# Patient Record
Sex: Male | Born: 1985 | Race: Black or African American | Hispanic: No | Marital: Single | Smoking: Former smoker
Health system: Southern US, Community
[De-identification: ages and names within clinical notes are randomized; demographics above are authoritative.]

## PROBLEM LIST (undated history)

## (undated) DIAGNOSIS — J40 Bronchitis, not specified as acute or chronic: Secondary | ICD-10-CM

## (undated) DIAGNOSIS — F319 Bipolar disorder, unspecified: Secondary | ICD-10-CM

## (undated) DIAGNOSIS — F909 Attention-deficit hyperactivity disorder, unspecified type: Secondary | ICD-10-CM

## (undated) DIAGNOSIS — M549 Dorsalgia, unspecified: Secondary | ICD-10-CM

## (undated) DIAGNOSIS — F489 Nonpsychotic mental disorder, unspecified: Secondary | ICD-10-CM

## (undated) DIAGNOSIS — M519 Unspecified thoracic, thoracolumbar and lumbosacral intervertebral disc disorder: Secondary | ICD-10-CM

---

## 2013-05-18 ENCOUNTER — Ambulatory Visit
Admission: RE | Admit: 2013-05-18 | Discharge: 2013-05-18 | Disposition: A | Discharge status: Home / Self Care | Payer: Medicaid Other | Source: Ambulatory Visit | Attending: Family Medicine | Admitting: Family Medicine

## 2013-05-18 ENCOUNTER — Other Ambulatory Visit: Payer: Self-pay | Admitting: Family Medicine

## 2013-05-18 DIAGNOSIS — M545 Low back pain, unspecified: Secondary | ICD-10-CM

## 2013-05-18 DIAGNOSIS — F17200 Nicotine dependence, unspecified, uncomplicated: Secondary | ICD-10-CM

## 2013-12-28 ENCOUNTER — Emergency Department (HOSPITAL_COMMUNITY)
Admission: EM | Admit: 2013-12-28 | Discharge: 2013-12-28 | Disposition: A | Discharge status: Home / Self Care | Payer: Medicaid Other | Attending: Emergency Medicine | Admitting: Emergency Medicine

## 2013-12-28 ENCOUNTER — Encounter (HOSPITAL_COMMUNITY): Payer: Self-pay | Admitting: Emergency Medicine

## 2013-12-28 DIAGNOSIS — L0231 Cutaneous abscess of buttock: Secondary | ICD-10-CM | POA: Insufficient documentation

## 2013-12-28 DIAGNOSIS — Z8739 Personal history of other diseases of the musculoskeletal system and connective tissue: Secondary | ICD-10-CM | POA: Insufficient documentation

## 2013-12-28 DIAGNOSIS — L03317 Cellulitis of buttock: Principal | ICD-10-CM

## 2013-12-28 DIAGNOSIS — F17200 Nicotine dependence, unspecified, uncomplicated: Secondary | ICD-10-CM | POA: Insufficient documentation

## 2013-12-28 MED ORDER — HYDROCODONE-ACETAMINOPHEN 5-325 MG PO TABS: 1.0000 | ORAL_TABLET | Freq: Four times a day (QID) | ORAL | Status: DC | PRN

## 2013-12-28 MED ORDER — LORAZEPAM 1 MG PO TABS
1.0000 mg | ORAL_TABLET | Freq: Once | ORAL | Status: AC
  Administered 2013-12-28: 1 mg via ORAL
  Filled 2013-12-28: qty 2

## 2013-12-28 MED ORDER — IBUPROFEN 800 MG PO TABS: 800.0000 mg | ORAL_TABLET | Freq: Three times a day (TID) | ORAL | Status: DC | PRN

## 2013-12-28 MED ORDER — HYDROMORPHONE HCL PF 1 MG/ML IJ SOLN
1.0000 mg | Freq: Once | INTRAMUSCULAR | Status: AC
  Administered 2013-12-28: 1 mg via INTRAMUSCULAR
  Filled 2013-12-28: qty 1

## 2013-12-28 NOTE — ED Provider Notes (Signed)
Medical screening examination/treatment/procedure(s) were performed by non-physician practitioner and as supervising physician I was immediately available for consultation/collaboration.  EKG Interpretation   None         Layla MawKristen N Ward, DO 12/28/13 1351

## 2013-12-28 NOTE — ED Provider Notes (Signed)
CSN: [id number]     Arrival date & time 12/28/13  1029 History   First MD Initiated Contact with Patient 12/28/13 1122     Chief Complaint  Patient presents with  . Hemorrhoids     (Consider location/radiation/quality/duration/timing/severity/associated sxs/prior Treatment) HPI HPI Comments: William Reese is a 28 y.o. male who presents to the Emergency Department complaining of an abscess on his right buttocks that has been there for 2 days. Patient doesn't not remember any injury that could have caused this.  He has had an abscess before in his armpits. Denies nausea, vomiting, diarrhea, fever, abdominal pain, chest pain, and SOB.   Past Medical History  Diagnosis Date  . Arthritis    History reviewed. No pertinent past surgical history. History reviewed. No pertinent family history. History  Substance Use Topics  . Smoking status: Current Every Day Smoker    Types: Cigarettes  . Smokeless tobacco: Not on file  . Alcohol Use: Yes    Review of Systems All other systems negative except as documented in the HPI. All pertinent positives and negatives as reviewed in the HPI.    Allergies  Flexeril  Home Medications   Current Outpatient Rx  Name  Route  Sig  Dispense  Refill  . phenylephrine-shark liver oil-mineral oil-petrolatum (PREPARATION H) 0.25-3-14-71.9 % rectal ointment   Rectal   Place 1 application rectally 2 (two) times daily as needed for hemorrhoids.          BP 140/84  Pulse 99  Temp(Src) 97.6 F (36.4 C) (Oral)  Resp 16  Ht 6' (1.829 m)  Wt 207 lb 12.8 oz (94.257 kg)  BMI 28.18 kg/m2  SpO2 97% Physical Exam  Constitutional: He is oriented to person, place, and time. He appears well-developed and well-nourished. No distress.  HENT:  Head: Normocephalic and atraumatic.  Neck: Normal range of motion. Neck supple.  Cardiovascular: Normal rate, regular rhythm and normal heart sounds.   Pulmonary/Chest: Effort normal and breath sounds normal.    Abdominal: Soft. Bowel sounds are normal. There is no tenderness.  Neurological: He is alert and oriented to person, place, and time.  Skin: Skin is warm and dry. No rash noted. He is not diaphoretic. No pallor.       ED Course  Procedures (including critical care time)   INCISION AND DRAINAGE Performed by: Carlyle DollyLAWYER,Harout Scheurich W Consent: Verbal consent obtained. Risks and benefits: risks, benefits and alternatives were discussed Type: abscess  Body area: Right medial mid buttock  Anesthesia: local infiltration  Incision was made with a scalpel.  Local anesthetic: lidocaine 2 % with epinephrine  Anesthetic total: 7 ml  Complexity: complex Blunt dissection to break up loculations  Drainage: purulent  Drainage amount: Large   Packing material: 1/4 in iodoform gauze  Patient tolerance: Patient tolerated the procedure well with no immediate complications.  Patient is referred to general surgery.  Told to return here as needed.  Told to use warm compresses around the area.  Keep the area covered   Carlyle DollyChristopher W Oria Klimas, PA-C 12/28/13 1310

## 2013-12-28 NOTE — ED Notes (Signed)
"  I'm ready to get it done..the numbing is wearing off".

## 2013-12-28 NOTE — ED Notes (Signed)
Pt states he has a very painful hemorrhoid since Monday hes applied preparation h and witch hazel and took hot baths with no relief. No bleeding

## 2013-12-28 NOTE — Discharge Instructions (Signed)
Return here as needed.  Followup with the surgeon, provided.  Use heat around the area keep the area covered.have the packing out in 2 days

## 2013-12-30 ENCOUNTER — Encounter (HOSPITAL_COMMUNITY): Payer: Self-pay | Admitting: Emergency Medicine

## 2013-12-30 ENCOUNTER — Emergency Department (HOSPITAL_COMMUNITY)
Admission: EM | Admit: 2013-12-30 | Discharge: 2013-12-30 | Disposition: A | Discharge status: Home / Self Care | Payer: Medicaid Other | Attending: Emergency Medicine | Admitting: Emergency Medicine

## 2013-12-30 DIAGNOSIS — F17200 Nicotine dependence, unspecified, uncomplicated: Secondary | ICD-10-CM | POA: Insufficient documentation

## 2013-12-30 DIAGNOSIS — Z09 Encounter for follow-up examination after completed treatment for conditions other than malignant neoplasm: Secondary | ICD-10-CM

## 2013-12-30 DIAGNOSIS — Z4801 Encounter for change or removal of surgical wound dressing: Secondary | ICD-10-CM | POA: Insufficient documentation

## 2013-12-30 DIAGNOSIS — M129 Arthropathy, unspecified: Secondary | ICD-10-CM | POA: Insufficient documentation

## 2013-12-30 NOTE — Discharge Instructions (Signed)
Abscess  Care After  An abscess (also called a boil or furuncle) is an infected area that contains a collection of pus. Signs and symptoms of an abscess include pain, tenderness, redness, or hardness, or you may feel a moveable soft area under your skin. An abscess can occur anywhere in the body. The infection may spread to surrounding tissues causing cellulitis. A cut (incision) by the surgeon was made over your abscess and the pus was drained out. Gauze may have been packed into the space to provide a drain that will allow the cavity to heal from the inside outwards. The boil may be painful for 5 to 7 days. Most people with a boil do not have high fevers. Your abscess, if seen early, may not have localized, and may not have been lanced. If not, another appointment may be required for this if it does not get better on its own or with medications.  HOME CARE INSTRUCTIONS   · Only take over-the-counter or prescription medicines for pain, discomfort, or fever as directed by your caregiver.  · When you bathe, soak and then remove gauze or iodoform packs at least daily or as directed by your caregiver. You may then wash the wound gently with mild soapy water. Repack with gauze or do as your caregiver directs.  SEEK IMMEDIATE MEDICAL CARE IF:   · You develop increased pain, swelling, redness, drainage, or bleeding in the wound site.  · You develop signs of generalized infection including muscle aches, chills, fever, or a general ill feeling.  · An oral temperature above 102° F (38.9° C) develops, not controlled by medication.  See your caregiver for a recheck if you develop any of the symptoms described above. If medications (antibiotics) were prescribed, take them as directed.  Document Released: 05/08/2005 Document Revised: 01/12/2012 Document Reviewed: 01/03/2008  ExitCare® Patient Information ©2014 [employer].

## 2013-12-30 NOTE — ED Provider Notes (Signed)
CSN: [id number]     Arrival date & time 12/30/13  1449 History  This chart was scribed for non-physician practitioner, Elpidio AnisShari Keiara Sneeringer, PA-C working with Gavin PoundMichael Y. Oletta LamasGhim, MD by Luisa DagoPriscilla Tutu, ED scribe. This patient was seen in room TR07C/TR07C and the patient's care was started at 6:11 PM.    Chief Complaint  Patient presents with  . Follow-up    The history is provided by the patient. No language interpreter was used.   HPI Comments: William Reese is a 28 y.o. male who presents to the Emergency Department requesting a follow up. Pt states that she had an abscess drained 2 days ago and is here to get the packing removed. Pt states that the pain has subsided a little from the last time he was in the ED. He denies any pertinent medical history. Denies any fever, chills, diarrhea, emesis, nausea, or diaphoresis.   Past Medical History  Diagnosis Date  . Arthritis    History reviewed. No pertinent past surgical history. History reviewed. No pertinent family history. History  Substance Use Topics  . Smoking status: Current Every Day Smoker    Types: Cigarettes  . Smokeless tobacco: Not on file  . Alcohol Use: Yes    Review of Systems  Constitutional: Negative for fever, chills and diaphoresis.  Gastrointestinal: Negative for nausea, vomiting and diarrhea.  Skin: Positive for wound (drained abscess to right buttock).   Allergies  Morphine and related and Flexeril  Home Medications   Current Outpatient Rx  Name  Route  Sig  Dispense  Refill  . HYDROcodone-acetaminophen (NORCO/VICODIN) 5-325 MG per tablet   Oral   Take 1 tablet by mouth every 6 (six) hours as needed for moderate pain.   15 tablet   0   . ibuprofen (ADVIL,MOTRIN) 800 MG tablet   Oral   Take 1 tablet (800 mg total) by mouth every 8 (eight) hours as needed.   21 tablet   0     BP 116/71  Pulse 118  Temp(Src) 98.4 F (36.9 C)  Resp 16  SpO2 96%  Physical Exam  Nursing note and vitals  reviewed. Constitutional: He is oriented to person, place, and time. He appears well-developed and well-nourished.  HENT:  Head: Normocephalic and atraumatic.  Cardiovascular: Normal rate.   Pulmonary/Chest: Effort normal.  Abdominal: He exhibits no distension.  Genitourinary:  Abscess with packing intact and mild to moderate continues purulent drainage that is malodorous. Induration to approximately 1 cm surrounding 2 cm incision.  Neurological: He is alert and oriented to person, place, and time.  Skin: Skin is warm and dry.  Psychiatric: He has a normal mood and affect.    ED Course  Procedures (including critical care time)  DIAGNOSTIC STUDIES: Oxygen Saturation is 96% on RA, low by my interpretation.    COORDINATION OF CARE: 6:18 PM- Packing was removed without difficulty. Pt advised of plan for treatment and pt agrees.  Labs Review Labs Reviewed - No data to display Imaging Review No results found.  EKG Interpretation  None  MDM   Final diagnoses:  None    1. Abscess recheck  Packing removed without difficulty. Care instructions given.  I personally performed the services described in this documentation, which was scribed in my presence. The recorded information has been reviewed and is accurate.     Arnoldo HookerShari A Michaeal Davis, PA-C 12/30/13 1825

## 2013-12-30 NOTE — ED Notes (Signed)
Per pt had abscess drained 2 days ago and is here for follow up and to have packing removed.

## 2013-12-30 NOTE — ED Notes (Signed)
Pt st's abscess has draining a lot.  St's wik still in place.

## 2013-12-31 NOTE — ED Provider Notes (Signed)
Medical screening examination/treatment/procedure(s) were performed by non-physician practitioner and as supervising physician I was immediately available for consultation/collaboration.   EKG Interpretation None        Lahoma Constantin Y. Decie Verne, MD 12/31/13 0046 

## 2014-02-16 ENCOUNTER — Emergency Department (HOSPITAL_COMMUNITY)
Admission: EM | Admit: 2014-02-16 | Discharge: 2014-02-16 | Disposition: A | Discharge status: Home / Self Care | Payer: Medicaid Other | Source: Home / Self Care

## 2014-02-16 ENCOUNTER — Encounter (HOSPITAL_COMMUNITY): Payer: Self-pay | Admitting: Emergency Medicine

## 2014-02-16 DIAGNOSIS — M549 Dorsalgia, unspecified: Secondary | ICD-10-CM

## 2014-02-16 DIAGNOSIS — G8929 Other chronic pain: Secondary | ICD-10-CM

## 2014-02-16 NOTE — ED Notes (Signed)
Pt      Observed  Walking  Out  Of  Exam room  Without  Discharge  Papers  Or     Plan  Of  Care

## 2014-02-16 NOTE — ED Provider Notes (Signed)
CSN: [id number]     Arrival date & time 02/16/14  1256 History   First MD Initiated Contact with Patient 02/16/14 1351     Chief Complaint  Patient presents with  . Medication Refill   (Consider location/radiation/quality/duration/timing/severity/associated sxs/prior Treatment) HPI Comments: Pt here for refill of his Norco for his chronic back pain. He was late for his appointment this AM with his PCP that regularly prescribes his med. He st his PCP told him to go to an Urgent Care.  Before I saw him one of our employees witnessed him run to his car for a phone call. PT is insisting his pain is currently severe 20/10 now.  St has hx of a disc problem   Past Medical History  Diagnosis Date  . Arthritis    History reviewed. No pertinent past surgical history. No family history on file. History  Substance Use Topics  . Smoking status: Current Every Day Smoker    Types: Cigarettes  . Smokeless tobacco: Not on file  . Alcohol Use: Yes    Review of Systems  Constitutional: Negative.   Musculoskeletal: Positive for back pain. Negative for joint swelling and neck pain.  Neurological: Negative.     Allergies  Morphine and related and Flexeril  Home Medications   Prior to Admission medications   Medication Sig Start Date End Date Taking? Authorizing Provider  HYDROcodone-acetaminophen (NORCO/VICODIN) 5-325 MG per tablet Take 1 tablet by mouth every 6 (six) hours as needed for moderate pain. 12/28/13   Jamesetta Orleanshristopher W Lawyer, PA-C  ibuprofen (ADVIL,MOTRIN) 800 MG tablet Take 1 tablet (800 mg total) by mouth every 8 (eight) hours as needed. 12/28/13   Jamesetta Orleanshristopher W Lawyer, PA-C   BP 134/77  Pulse 66  Temp(Src) 98 F (36.7 C) (Oral)  Resp 14  SpO2 98% Physical Exam  Nursing note and vitals reviewed. Constitutional: He is oriented to person, place, and time. He appears well-developed and well-nourished.  Relaxed posturing, was smiling on initial arrival, able to get onto and off  the table energetically and without difficulty . Demonstrates no limitations in movement, ambulation, sitting and standing. No facial expressions of pain. Does not appear to be in any distress or pain which an observer would believe pain level 20/10.   HENT:  Head: Normocephalic and atraumatic.  Eyes: EOM are normal. Left eye exhibits no discharge.  Neck: Normal range of motion. Neck supple.  Musculoskeletal:  Tenderness to the bilat paralumbar musculature. No swelling or deformities.  Neurological: He is alert and oriented to person, place, and time. No cranial nerve deficit.  Skin: Skin is warm and dry.  Psychiatric: He has a normal mood and affect.    ED Course  Procedures (including critical care time) Labs Review Labs Reviewed - No data to display  No results found for this or any previous visit. Imaging Review No results found.   MDM   1. Chronic back pain greater than 3 months duration     When pt was advised that we would not refill his monthly requirements of Norco he became dissatisfied and left. He was advised that I could give him 5 tablets today if he is in severe pain but no more. He declined and walked out.    Hayden Rasmussenavid Roniesha Hollingshead, NP 02/16/14 1416

## 2014-02-16 NOTE — Discharge Instructions (Signed)
Chronic Back Pain   When back pain lasts longer than 3 months, it is called chronic back pain.People with chronic back pain often go through certain periods that are more intense (flare-ups).   CAUSES  Chronic back pain can be caused by wear and tear (degeneration) on different structures in your back. These structures include:   The bones of your spine (vertebrae) and the joints surrounding your spinal cord and nerve roots (facets).   The strong, fibrous tissues that connect your vertebrae (ligaments).  Degeneration of these structures may result in pressure on your nerves. This can lead to constant pain.  HOME CARE INSTRUCTIONS   Avoid bending, heavy lifting, prolonged sitting, and activities which make the problem worse.   Take brief periods of rest throughout the day to reduce your pain. Lying down or standing usually is better than sitting while you are resting.   Take over-the-counter or prescription medicines only as directed by your caregiver.  SEEK IMMEDIATE MEDICAL CARE IF:    You have weakness or numbness in one of your legs or feet.   You have trouble controlling your bladder or bowels.   You have nausea, vomiting, abdominal pain, shortness of breath, or fainting.  Document Released: 11/27/2004 Document Revised: 01/12/2012 Document Reviewed: 10/04/2011  ExitCare Patient Information 2014 [employer].

## 2014-02-16 NOTE — ED Notes (Signed)
Pt  Reports  Low  Back  Pain   He      Presents  To  ucc  Requesting  A  Refill  Of  His  Hydrocodone         He  States  He    Missed  His  appt  With  Dr  Chriss DriverHeidi  Little            -   He  Reports  A  History   Of  Back  Problems              He  denys  An  Recent  Injury    - he  Is  Up  Ambulating in room

## 2014-02-17 NOTE — ED Provider Notes (Signed)
Medical screening examination/treatment/procedure(s) were performed by a resident physician or non-physician practitioner and as the supervising physician I was immediately available for consultation/collaboration.  Chanti Golubski, MD    Katreena Schupp S Amandajo Gonder, MD 02/17/14 0805 

## 2015-01-27 ENCOUNTER — Emergency Department (HOSPITAL_COMMUNITY)
Admission: EM | Admit: 2015-01-27 | Discharge: 2015-01-27 | Disposition: A | Discharge status: Home / Self Care | Payer: Medicaid Other | Source: Home / Self Care | Attending: Emergency Medicine | Admitting: Emergency Medicine

## 2015-01-27 ENCOUNTER — Encounter (HOSPITAL_COMMUNITY): Payer: Self-pay

## 2015-01-27 ENCOUNTER — Other Ambulatory Visit (HOSPITAL_COMMUNITY)
Admission: RE | Admit: 2015-01-27 | Discharge: 2015-01-27 | Disposition: A | Discharge status: Home / Self Care | Payer: Medicaid Other | Source: Ambulatory Visit | Attending: Emergency Medicine | Admitting: Emergency Medicine

## 2015-01-27 DIAGNOSIS — Z113 Encounter for screening for infections with a predominantly sexual mode of transmission: Secondary | ICD-10-CM | POA: Diagnosis not present

## 2015-01-27 DIAGNOSIS — Z202 Contact with and (suspected) exposure to infections with a predominantly sexual mode of transmission: Secondary | ICD-10-CM | POA: Diagnosis not present

## 2015-01-27 HISTORY — DX: Nonpsychotic mental disorder, unspecified: F48.9

## 2015-01-27 LAB — POCT URINALYSIS DIP (DEVICE)
Bilirubin Urine: NEGATIVE
Glucose, UA: NEGATIVE mg/dL
Hgb urine dipstick: NEGATIVE
Ketones, ur: NEGATIVE mg/dL
Nitrite: NEGATIVE
PROTEIN: 100 mg/dL — AB
Specific Gravity, Urine: >=1.03 (ref 1.005–1.030)
Urobilinogen, UA: 1 mg/dL (ref 0.0–1.0)
pH: 8.5 — ABNORMAL HIGH (ref 5.0–8.0)

## 2015-01-27 MED ORDER — AZITHROMYCIN 250 MG PO TABS
1000.0000 mg | ORAL_TABLET | Freq: Once | ORAL | Status: AC
  Administered 2015-01-27: 1000 mg via ORAL

## 2015-01-27 MED ORDER — AZITHROMYCIN 250 MG PO TABS
ORAL_TABLET | ORAL | Status: AC
  Filled 2015-01-27: qty 4

## 2015-01-27 MED ORDER — CEFTRIAXONE SODIUM 250 MG IJ SOLR
250.0000 mg | Freq: Once | INTRAMUSCULAR | Status: AC
  Administered 2015-01-27: 250 mg via INTRAMUSCULAR

## 2015-01-27 MED ORDER — CEFTRIAXONE SODIUM 250 MG IJ SOLR
INTRAMUSCULAR | Status: AC
  Filled 2015-01-27: qty 250

## 2015-01-27 MED ORDER — LIDOCAINE HCL (PF) 1 % IJ SOLN
INTRAMUSCULAR | Status: AC
  Filled 2015-01-27: qty 5

## 2015-01-27 NOTE — ED Notes (Signed)
States he was advised by his girlfriend to be checked for STD. C/o pain ,burning w UA, and urethral d/c

## 2015-01-27 NOTE — ED Provider Notes (Signed)
CSN: [id number]     Arrival date & time 01/27/15  1534 History   First MD Initiated Contact with Patient 01/27/15 1603     Chief Complaint  Patient presents with  . SEXUALLY TRANSMITTED DISEASE   (Consider location/radiation/quality/duration/timing/severity/associated sxs/prior Treatment) HPI  He is a 29 year old man here for evaluation of STD exposure. He states his ex-girlfriend told him he needed to be checked for STDs. He states that in the last day he has had penile discharge, dysuria, and testicular pain. No fevers or chills. No nausea or vomiting.  Past Medical History  Diagnosis Date  . Arthritis   . Mental health problem    History reviewed. No pertinent past surgical history. History reviewed. No pertinent family history. History  Substance Use Topics  . Smoking status: Current Every Day Smoker    Types: Cigarettes  . Smokeless tobacco: Not on file  . Alcohol Use: Yes    Review of Systems  Constitutional: Negative for fever and chills.  Gastrointestinal: Negative for nausea and vomiting.  Genitourinary: Positive for dysuria, discharge and testicular pain.    Allergies  Morphine and related and Flexeril  Home Medications   Prior to Admission medications   Medication Sig Start Date End Date Taking? Authorizing Provider  ARIPiprazole (ABILIFY) 10 MG tablet Take 10 mg by mouth daily.   Yes Historical Provider, MD  HYDROcodone-acetaminophen (NORCO/VICODIN) 5-325 MG per tablet Take 1 tablet by mouth every 6 (six) hours as needed for moderate pain. 12/28/13  Yes Christopher Lawyer, PA-C  lisdexamfetamine (VYVANSE) 20 MG capsule Take 20 mg by mouth daily.   Yes Historical Provider, MD  ibuprofen (ADVIL,MOTRIN) 800 MG tablet Take 1 tablet (800 mg total) by mouth every 8 (eight) hours as needed. 12/28/13   Christopher Lawyer, PA-C   BP 127/75 mmHg  Pulse 79  Temp(Src) 98.2 F (36.8 C) (Oral)  Resp 14  SpO2 100% Physical Exam  Constitutional: He is oriented to person,  place, and time. He appears well-developed and well-nourished. No distress.  Cardiovascular: Normal rate.   Pulmonary/Chest: Effort normal.  Neurological: He is alert and oriented to person, place, and time.    ED Course  Procedures (including critical care time) Labs Review Labs Reviewed  POCT URINALYSIS DIP (DEVICE) - Abnormal; Notable for the following:    pH 8.5 (*)    Protein, ur 100 (*)    Leukocytes, UA TRACE (*)    All other components within normal limits  HIV ANTIBODY (ROUTINE TESTING)  RPR  URINE CYTOLOGY ANCILLARY ONLY    Imaging Review No results found.   MDM   1. STD exposure    Will treat presumptively with Rocephin 250 mg IM and azithromycin 1 g by mouth.  HIV, RPR, urine cytology sent. Follow-up as needed.    Charm RingsErin J Haziel Molner, MD 01/27/15 [phone]

## 2015-01-27 NOTE — ED Notes (Signed)
Call back number for lab issues verified 

## 2015-01-27 NOTE — Discharge Instructions (Signed)
You were treated for gonorrhea and Chlamydia today. We will call you if any of your testing comes back positive. Follow-up as needed.

## 2015-01-28 LAB — RPR: RPR: NONREACTIVE

## 2015-01-28 LAB — HIV ANTIBODY (ROUTINE TESTING W REFLEX): HIV SCREEN 4TH GENERATION: NONREACTIVE

## 2015-01-29 ENCOUNTER — Telehealth (HOSPITAL_COMMUNITY): Payer: Self-pay | Admitting: *Deleted

## 2015-01-29 LAB — URINE CYTOLOGY ANCILLARY ONLY
Chlamydia: NEGATIVE
NEISSERIA GONORRHEA: POSITIVE — AB
Trichomonas: NEGATIVE

## 2015-01-29 NOTE — ED Notes (Addendum)
GC pos., Chlamydia and Trich neg., HIV/RPR non-reactive.  I called pt. Pt. verified x 2 and given results.  Pt. Told he was adequately treated with Rocephin and Zithromax.  Pt. instructed to notify his partner, no sex for 1 week and to practice safe sex. Pt. told he [facility] 6 mos[facility], by appointment. DHHS [facility]. [name], [name] 01/29/2015

## 2015-11-01 ENCOUNTER — Emergency Department (HOSPITAL_COMMUNITY): Payer: Medicaid Other

## 2015-11-01 ENCOUNTER — Emergency Department (HOSPITAL_COMMUNITY)
Admission: EM | Admit: 2015-11-01 | Discharge: 2015-11-01 | Disposition: A | Discharge status: Home / Self Care | Payer: Medicaid Other | Attending: Emergency Medicine | Admitting: Emergency Medicine

## 2015-11-01 ENCOUNTER — Encounter (HOSPITAL_COMMUNITY): Payer: Self-pay | Admitting: Emergency Medicine

## 2015-11-01 DIAGNOSIS — F410 Panic disorder [episodic paroxysmal anxiety] without agoraphobia: Secondary | ICD-10-CM | POA: Diagnosis not present

## 2015-11-01 DIAGNOSIS — M1990 Unspecified osteoarthritis, unspecified site: Secondary | ICD-10-CM | POA: Insufficient documentation

## 2015-11-01 DIAGNOSIS — Z79899 Other long term (current) drug therapy: Secondary | ICD-10-CM | POA: Insufficient documentation

## 2015-11-01 DIAGNOSIS — R079 Chest pain, unspecified: Secondary | ICD-10-CM | POA: Diagnosis present

## 2015-11-01 DIAGNOSIS — Z87891 Personal history of nicotine dependence: Secondary | ICD-10-CM | POA: Diagnosis not present

## 2015-11-01 LAB — CBC
HCT: 45.8 % (ref 39.0–52.0)
HEMOGLOBIN: 15.9 g/dL (ref 13.0–17.0)
MCH: 30.7 pg (ref 26.0–34.0)
MCHC: 34.7 g/dL (ref 30.0–36.0)
MCV: 88.4 fL (ref 78.0–100.0)
Platelets: 234 10*3/uL (ref 150–400)
RBC: 5.18 MIL/uL (ref 4.22–5.81)
RDW: 12.5 % (ref 11.5–15.5)
WBC: 7.2 10*3/uL (ref 4.0–10.5)

## 2015-11-01 LAB — BASIC METABOLIC PANEL
ANION GAP: 9 (ref 5–15)
BUN: 7 mg/dL (ref 6–20)
CO2: 25 mmol/L (ref 22–32)
Calcium: 9.3 mg/dL (ref 8.9–10.3)
Chloride: 105 mmol/L (ref 101–111)
Creatinine, Ser: 0.75 mg/dL (ref 0.61–1.24)
GFR calc Af Amer: >60 mL/min (ref >60)
GFR calc non Af Amer: >60 mL/min (ref >60)
GLUCOSE: 101 mg/dL — AB (ref 65–99)
Potassium: 3.7 mmol/L (ref 3.5–5.1)
Sodium: 139 mmol/L (ref 135–145)

## 2015-11-01 LAB — I-STAT TROPONIN, ED: Troponin i, poc: 0 ng/mL (ref 0.00–0.08)

## 2015-11-01 MED ORDER — DIAZEPAM 5 MG PO TABS: 5.0000 mg | ORAL_TABLET | Freq: Three times a day (TID) | ORAL | Status: DC | PRN

## 2015-11-01 NOTE — ED Provider Notes (Signed)
CSN: [id number]     Arrival date & time 11/01/15  1616 History   First MD Initiated Contact with Patient 11/01/15 1943     Chief Complaint  Patient presents with  . Chest Pain  . Shortness of Breath     (Consider location/radiation/quality/duration/timing/severity/associated sxs/prior Treatment) HPI Comments: Patient currently undergoing counseling for evaluation of borderline personality disorder.  Over the last several weeks, patient has been having recurrent episodes of anxiousness with rapid heart rate, breathing, and sensation of choking.  No history of congenital heart disease or early heart disease in this family.  Former smoker.  Patient is a 29 y.o. male presenting with chest pain, shortness of breath, and anxiety.  Chest Pain Associated symptoms: anxiety and shortness of breath   Shortness of Breath Associated symptoms: chest pain   Anxiety This is a recurrent problem. The current episode started 1 to 4 weeks ago. The problem occurs every several days. The problem has been waxing and waning. Associated symptoms include chest pain. Associated symptoms comments: Fast breathing, sensation of throat closing. The symptoms are aggravated by stress.    Past Medical History  Diagnosis Date  . Arthritis   . Mental health problem    History reviewed. No pertinent past surgical history. History reviewed. No pertinent family history. Social History  Substance Use Topics  . Smoking status: Former Smoker    Types: Cigarettes  . Smokeless tobacco: None  . Alcohol Use: Yes    Review of Systems  Respiratory: Positive for shortness of breath.   Cardiovascular: Positive for chest pain.  Psychiatric/Behavioral: The patient is nervous/anxious.   All other systems reviewed and are negative.     Allergies  Morphine and related and Flexeril  Home Medications   Prior to Admission medications   Medication Sig Start Date End Date Taking? Authorizing Provider  ARIPiprazole  (ABILIFY) 10 MG tablet Take 10 mg by mouth daily.    Historical Provider, MD  HYDROcodone-acetaminophen (NORCO/VICODIN) 5-325 MG per tablet Take 1 tablet by mouth every 6 (six) hours as needed for moderate pain. 12/28/13   Charlestine Night, PA-C  ibuprofen (ADVIL,MOTRIN) 800 MG tablet Take 1 tablet (800 mg total) by mouth every 8 (eight) hours as needed. 12/28/13   Charlestine Night, PA-C  lisdexamfetamine (VYVANSE) 20 MG capsule Take 20 mg by mouth daily.    Historical Provider, MD   BP 144/83 mmHg  Pulse 80  Temp(Src) 98.1 F (36.7 C) (Oral)  Resp 18  SpO2 99% Physical Exam  Constitutional: He is oriented to person, place, and time. He appears well-developed and well-nourished.  HENT:  Head: Normocephalic and atraumatic.  Eyes: Pupils are equal, round, and reactive to light.  Neck: Neck supple.  Cardiovascular: Normal rate, regular rhythm, normal heart sounds and intact distal pulses.   No murmur heard. Pulmonary/Chest: Effort normal and breath sounds normal. No respiratory distress.  Abdominal: Soft. Bowel sounds are normal.  Musculoskeletal: He exhibits no edema or tenderness.  Lymphadenopathy:    He has no cervical adenopathy.  Neurological: He is alert and oriented to person, place, and time.  Skin: Skin is warm and dry.  Psychiatric: His mood appears anxious.  Nursing note and vitals reviewed.   ED Course  Procedures (including critical care time) Labs Review Labs Reviewed  BASIC METABOLIC PANEL - Abnormal; Notable for the following:    Glucose, Bld 101 (*)    All other components within normal limits  CBC  I-STAT TROPOININ, ED    Imaging Review Dg  Chest 2 View  11/01/2015  CLINICAL DATA:  Left-sided chest pain and shortness of Breath EXAM: CHEST - 2 VIEW COMPARISON:  05/18/2013 FINDINGS: The heart size and mediastinal contours are within normal limits. Both lungs are clear. The visualized skeletal structures are unremarkable. IMPRESSION: No active disease.  Electronically Signed   By: Alcide CleverMark  Lukens M.D.   On: 11/01/2015 17:00   I have personally reviewed and evaluated these images and lab results as part of my medical decision-making.   EKG Interpretation   Date/Time:  Thursday November 01 2015 16:29:10 EST Ventricular Rate:  78 PR Interval:  178 QRS Duration: 76 QT Interval:  359 QTC Calculation: 409 R Axis:   70 Text Interpretation:  Sinus rhythm normal axis and intervals no ischemic  changes  No old tracing to compare Confirmed by Rhunette CroftNANAVATI, MD, Janey GentaANKIT  [phone]) on 11/01/2015 8:30:02 PM     Patient's symptoms consistent with panic anxiety. Normal ECG and troponin. No history of early heart disease in family. MDM   Final diagnoses:  None    Anxiety. Care instructions provided. Return precautions discussed.   Felicie Mornavid Shareece Bultman, NP 11/02/15 [id number]  Derwood KaplanAnkit Nanavati, MD 11/02/15 [id number]

## 2015-11-01 NOTE — ED Notes (Signed)
Pt c/o chest tightness and sensation of "heart pounding really fast even though I am calm" onset 10-12-15, with twitching in left arm, intermittent sensation of throat closing.

## 2015-11-01 NOTE — ED Notes (Signed)
Pt reports his cough has been persistent since 12/9, pt believes his cough is attributed to the fact Mold is in his house. Pt reports the county department is coming to his house to evaluate the Mold concerns.

## 2015-11-01 NOTE — Discharge Instructions (Signed)

## 2015-11-01 NOTE — ED Notes (Signed)
No answer from waiting room.

## 2015-11-21 ENCOUNTER — Encounter (HOSPITAL_COMMUNITY): Payer: Self-pay | Admitting: Family Medicine

## 2015-11-21 ENCOUNTER — Emergency Department (HOSPITAL_COMMUNITY): Payer: Medicaid Other

## 2015-11-21 ENCOUNTER — Emergency Department (HOSPITAL_COMMUNITY)
Admission: EM | Admit: 2015-11-21 | Discharge: 2015-11-21 | Disposition: A | Discharge status: Home / Self Care | Payer: Medicaid Other | Attending: Emergency Medicine | Admitting: Emergency Medicine

## 2015-11-21 ENCOUNTER — Emergency Department (HOSPITAL_COMMUNITY)
Admission: EM | Admit: 2015-11-21 | Discharge: 2015-11-21 | Discharge status: Against Medical Advice | Payer: Medicaid Other | Source: Home / Self Care | Attending: Emergency Medicine | Admitting: Emergency Medicine

## 2015-11-21 DIAGNOSIS — Z87891 Personal history of nicotine dependence: Secondary | ICD-10-CM | POA: Insufficient documentation

## 2015-11-21 DIAGNOSIS — F410 Panic disorder [episodic paroxysmal anxiety] without agoraphobia: Secondary | ICD-10-CM | POA: Insufficient documentation

## 2015-11-21 DIAGNOSIS — Z79899 Other long term (current) drug therapy: Secondary | ICD-10-CM

## 2015-11-21 DIAGNOSIS — R0602 Shortness of breath: Secondary | ICD-10-CM | POA: Diagnosis not present

## 2015-11-21 DIAGNOSIS — R079 Chest pain, unspecified: Secondary | ICD-10-CM | POA: Diagnosis present

## 2015-11-21 DIAGNOSIS — Z8709 Personal history of other diseases of the respiratory system: Secondary | ICD-10-CM | POA: Insufficient documentation

## 2015-11-21 DIAGNOSIS — R0789 Other chest pain: Secondary | ICD-10-CM | POA: Insufficient documentation

## 2015-11-21 DIAGNOSIS — Z8739 Personal history of other diseases of the musculoskeletal system and connective tissue: Secondary | ICD-10-CM

## 2015-11-21 DIAGNOSIS — F419 Anxiety disorder, unspecified: Secondary | ICD-10-CM

## 2015-11-21 HISTORY — DX: Unspecified thoracic, thoracolumbar and lumbosacral intervertebral disc disorder: M51.9

## 2015-11-21 HISTORY — DX: Bronchitis, not specified as acute or chronic: J40

## 2015-11-21 LAB — BASIC METABOLIC PANEL
ANION GAP: 16 — AB (ref 5–15)
BUN: 8 mg/dL (ref 6–20)
CO2: 20 mmol/L — ABNORMAL LOW (ref 22–32)
Calcium: 9.8 mg/dL (ref 8.9–10.3)
Chloride: 105 mmol/L (ref 101–111)
Creatinine, Ser: 0.75 mg/dL (ref 0.61–1.24)
Glucose, Bld: 83 mg/dL (ref 65–99)
POTASSIUM: 3.5 mmol/L (ref 3.5–5.1)
SODIUM: 141 mmol/L (ref 135–145)

## 2015-11-21 LAB — CBC
HEMATOCRIT: 44.2 % (ref 39.0–52.0)
HEMOGLOBIN: 15.6 g/dL (ref 13.0–17.0)
MCH: 31 pg (ref 26.0–34.0)
MCHC: 35.3 g/dL (ref 30.0–36.0)
MCV: 87.7 fL (ref 78.0–100.0)
Platelets: 224 10*3/uL (ref 150–400)
RBC: 5.04 MIL/uL (ref 4.22–5.81)
RDW: 12.6 % (ref 11.5–15.5)
WBC: 10 10*3/uL (ref 4.0–10.5)

## 2015-11-21 LAB — I-STAT TROPONIN, ED: Troponin i, poc: 0 ng/mL (ref 0.00–0.08)

## 2015-11-21 LAB — D-DIMER, QUANTITATIVE (NOT AT ARMC)

## 2015-11-21 MED ORDER — NAPROXEN 375 MG PO TABS: 375.0000 mg | ORAL_TABLET | Freq: Two times a day (BID) | ORAL | Status: DC

## 2015-11-21 MED ORDER — LORAZEPAM 2 MG/ML IJ SOLN
1.0000 mg | Freq: Once | INTRAMUSCULAR | Status: DC
  Filled 2015-11-21: qty 1

## 2015-11-21 MED ORDER — HYDROXYZINE HCL 25 MG PO TABS: 25.0000 mg | ORAL_TABLET | Freq: Four times a day (QID) | ORAL | Status: DC | PRN

## 2015-11-21 MED ORDER — LORAZEPAM 1 MG PO TABS
1.0000 mg | ORAL_TABLET | Freq: Once | ORAL | Status: AC
  Administered 2015-11-21: 1 mg via ORAL
  Filled 2015-11-21: qty 1

## 2015-11-21 MED ORDER — SODIUM CHLORIDE 0.9 % IV BOLUS (SEPSIS): 500.0000 mL | Freq: Once | INTRAVENOUS | Status: DC

## 2015-11-21 NOTE — ED Provider Notes (Signed)
CSN: [id number]     Arrival date & time 11/21/15  0035 History  By signing my name below, I, Soijett Blue, attest that this documentation has been prepared under the direction and in the presence of Gilda Crease, MD. Electronically Signed: Soijett Blue, ED Scribe. 11/21/2015. 1:28 AM.   Chief Complaint  Patient presents with  . Chest Pain      The history is provided by the patient. No language interpreter was used.    HPI Comments: William Reese is a 30 y.o. male with a medical hx of mental health problem and anxiety who presents to the Emergency Department via EMS complaining of intermittent CP onset 30 minutes. He describes the CP as a pressure sensation that radiates into his left shoulder and arm. Pt thinks that his CP is occurring due to there being mold in his house. He states that he is having associated symptoms of SOB. He states that he has not tried any medications for the relief for his symptoms. He denies gait problem, n/v, and any other symptoms.   Per EMS: EMS personnel attempted to give the pt ASA en route to which the pt declined.   Per pt chart review: Pt was seen in the ED on 11/01/2015 for similar symptoms and had normal CXR, EKG, and labs. Pt was Rx valium for the relief of his symptoms.   Past Medical History  Diagnosis Date  . Mental health problem   . Lumbar disc disease   . Bronchitis    No past surgical history on file. No family history on file. Social History  Substance Use Topics  . Smoking status: Former Smoker    Types: Cigarettes  . Smokeless tobacco: Not on file  . Alcohol Use: Yes     Comment: "Every now and then"    Review of Systems  Cardiovascular: Positive for chest pain.  Gastrointestinal: Negative for nausea and vomiting.  All other systems reviewed and are negative.     Allergies  Morphine and related and Flexeril  Home Medications   Prior to Admission medications   Medication Sig Start Date End Date Taking?  Authorizing Provider  Cholecalciferol (VITAMIN D PO) Take 1 capsule by mouth daily.    Historical Provider, MD  Cyanocobalamin (VITAMIN B12 PO) Take 1 capsule by mouth daily.    Historical Provider, MD  hydrOXYzine (ATARAX/VISTARIL) 25 MG tablet Take 1 tablet (25 mg total) by mouth every 6 (six) hours as needed for anxiety. 11/21/15   Tilden Fossa, MD  lurasidone (LATUDA) 40 MG TABS tablet Take 20 mg by mouth daily with breakfast.    Historical Provider, MD  naproxen (NAPROSYN) 375 MG tablet Take 1 tablet (375 mg total) by mouth 2 (two) times daily. 11/21/15   Tilden Fossa, MD   BP 141/80 mmHg  Pulse 94  Temp(Src) 98.8 F (37.1 C) (Oral)  Resp 25  SpO2 100% Physical Exam  Constitutional: He is oriented to person, place, and time. He appears well-developed and well-nourished. No distress.  Pt is anxious upon exam  HENT:  Head: Normocephalic and atraumatic.  Right Ear: Hearing normal.  Left Ear: Hearing normal.  Nose: Nose normal.  Mouth/Throat: Oropharynx is clear and moist and mucous membranes are normal.  Eyes: Conjunctivae and EOM are normal. Pupils are equal, round, and reactive to light.  Neck: Normal range of motion. Neck supple.  Cardiovascular: Regular rhythm, S1 normal and S2 normal.  Exam reveals no gallop and no friction rub.   No murmur  heard. Pulmonary/Chest: Effort normal and breath sounds normal. No respiratory distress. He exhibits no tenderness.  Abdominal: Soft. Normal appearance and bowel sounds are normal. There is no hepatosplenomegaly. There is no tenderness. There is no rebound, no guarding, no tenderness at McBurney's point and negative Murphy's sign. No hernia.  Musculoskeletal: Normal range of motion.  Neurological: He is alert and oriented to person, place, and time. He has normal strength. No cranial nerve deficit or sensory deficit. Coordination normal. GCS eye subscore is 4. GCS verbal subscore is 5. GCS motor subscore is 6.  Skin: Skin is warm, dry and  intact. No rash noted. No cyanosis.  Psychiatric: He has a normal mood and affect. His speech is normal and behavior is normal. Thought content normal.  Nursing note and vitals reviewed.   ED Course  Procedures (including critical care time) DIAGNOSTIC STUDIES: Oxygen Saturation is 100% on RA, nl by my interpretation.    COORDINATION OF CARE: 1:16 AM Discussed treatment plan with pt at bedside which includes CXR, EKG, labs and pt agreed to plan.  1:23 AM- Pt leaving AMA due to not wanting to answer questions when asked by the nursing staff. Pt reports to the nursing staff that he will be going to the "competitor" i.e. [facility] and that he wasn't getting the care that he requires.   Labs Review Labs Reviewed - No data to display  Imaging Review Dg Chest 2 View  11/21/2015  CLINICAL DATA:  Acute onset of upper chest pain and shortness of breath. Initial encounter. EXAM: CHEST  2 VIEW COMPARISON:  Chest radiograph performed 11/01/2015 FINDINGS: The lungs are well-aerated and clear. There is no evidence of focal opacification, pleural effusion or pneumothorax. The heart is normal in size; the mediastinal contour is within normal limits. No acute osseous abnormalities are seen. IMPRESSION: No acute cardiopulmonary process seen. Electronically Signed   By: Roanna Raider M.D.   On: 11/21/2015 02:49   I have personally reviewed and evaluated these images and lab results as part of my medical decision-making.   EKG Interpretation   Date/Time:  Wednesday November 21 2015 01:09:27 EST Ventricular Rate:  89 PR Interval:  191 QRS Duration: 74 QT Interval:  340 QTC Calculation: 414 R Axis:   77 Text Interpretation:  Sinus rhythm Normal ECG Confirmed by Ailine Hefferan  MD,  Kanton Kamel [phone]) on 11/21/2015 1:14:10 AM      MDM   Final diagnoses:  None  Panic attack  Presents to the emergency department with complaints of chest discomfort, heart palpitations, rapid breathing, shortness of breath.  Patient has been seen in this ER for similar symptoms before and diagnosed with panic attack. I do believe that this is the appropriate diagnosis for the patient. Patient did, however, become agitated immediately after my evaluation of him and left the emergency department before finishing the encounter.  I personally performed the services described in this documentation, which was scribed in my presence. The recorded information has been reviewed and is accurate.       Gilda Crease, MD 11/21/15 [phone]

## 2015-11-21 NOTE — ED Notes (Signed)
Patient arrived via [facility]. EMS reports patient c/o chest "pressure" radiating to L shoulder/arm. Reports shortness of breath. Denies nausea/vomiting. EMS reports that patient refused ASA. Patient has history of anxiety, and reports he feels this may be related.

## 2015-11-21 NOTE — ED Provider Notes (Signed)
CSN: [id number]     Arrival date & time 11/21/15  0143 History   First MD Initiated Contact with Patient 11/21/15 0423     Chief Complaint  Patient presents with  . Chest Pain     The history is provided by the patient. No language interpreter was used.   William Reese is a 30 y.o. male who presents to the Emergency Department complaining of chest pain. He reports 1 week of chest pain and shortness of breath. He reports constant shortness of breath that is worse when he is laying flat and at night. He has associated left sided chest pain that goes to her shoulder and upper arm. The pain is worse when he is lying on his abdomen. He can't lay on his abdomen to sleep because he feels like he is claustrophobic. There is no change in his shortness of breath or chest pain with exertion or activities or meals. He denies any fevers, cough, abdominal pain, extremity swelling or pain. He has a history of anxiety and ADHD. He denies any history of blood clots or cardiac disease.  Past Medical History  Diagnosis Date  . Mental health problem   . Lumbar disc disease   . Bronchitis    History reviewed. No pertinent past surgical history. History reviewed. No pertinent family history. Social History  Substance Use Topics  . Smoking status: Former Smoker    Types: Cigarettes  . Smokeless tobacco: None  . Alcohol Use: Yes     Comment: "Every now and then"    Review of Systems  All other systems reviewed and are negative.     Allergies  Morphine and related and Flexeril  Home Medications   Prior to Admission medications   Medication Sig Start Date End Date Taking? Authorizing Provider  Cholecalciferol (VITAMIN D PO) Take 1 capsule by mouth daily.   Yes Historical Provider, MD  Cyanocobalamin (VITAMIN B12 PO) Take 1 capsule by mouth daily.   Yes Historical Provider, MD  lurasidone (LATUDA) 40 MG TABS tablet Take 20 mg by mouth daily with breakfast.   Yes Historical Provider, MD   hydrOXYzine (ATARAX/VISTARIL) 25 MG tablet Take 1 tablet (25 mg total) by mouth every 6 (six) hours as needed for anxiety. 11/21/15   Tilden Fossa, MD  naproxen (NAPROSYN) 375 MG tablet Take 1 tablet (375 mg total) by mouth 2 (two) times daily. 11/21/15   Tilden Fossa, MD   BP 132/79 mmHg  Pulse 90  Temp(Src) 98.2 F (36.8 C) (Oral)  Resp 18  Ht  (1.778 m)  Wt 220 lb (99.791 kg)  BMI 31.57 kg/m2  SpO2 100% Physical Exam  Constitutional: He is oriented to person, place, and time. He appears well-developed and well-nourished.  HENT:  Head: Normocephalic and atraumatic.  Cardiovascular: Normal rate and regular rhythm.   No murmur heard. Pulmonary/Chest: Effort normal and breath sounds normal. No respiratory distress. He exhibits tenderness.  Abdominal: Soft. There is no tenderness. There is no rebound and no guarding.  Musculoskeletal: He exhibits no edema or tenderness.  Neurological: He is alert and oriented to person, place, and time.  Skin: Skin is warm and dry.  Psychiatric: He has a normal mood and affect. His behavior is normal.  Nursing note and vitals reviewed.   ED Course  Procedures (including critical care time) Labs Review Labs Reviewed  BASIC METABOLIC PANEL - Abnormal; Notable for the following:    CO2 20 (*)    Anion gap 16 (*)  All other components within normal limits  CBC  D-DIMER, QUANTITATIVE (NOT AT [facility])  Rosezena Sensor, ED    Imaging Review Dg Chest 2 View  11/21/2015  CLINICAL DATA:  Acute onset of upper chest pain and shortness of breath. Initial encounter. EXAM: CHEST  2 VIEW COMPARISON:  Chest radiograph performed 11/01/2015 FINDINGS: The lungs are well-aerated and clear. There is no evidence of focal opacification, pleural effusion or pneumothorax. The heart is normal in size; the mediastinal contour is within normal limits. No acute osseous abnormalities are seen. IMPRESSION: No acute cardiopulmonary process seen. Electronically  Signed   By: Roanna Raider M.D.   On: 11/21/2015 02:49   I have personally reviewed and evaluated these images and lab results as part of my medical decision-making.   EKG Interpretation   Date/Time:  Wednesday November 21 2015 01:55:37 EST Ventricular Rate:  101 PR Interval:  158 QRS Duration: 73 QT Interval:  311 QTC Calculation: 403 R Axis:   78 Text Interpretation:  Sinus tachycardia Borderline T wave abnormalities  Confirmed by Lincoln Brigham [phone]) on 11/21/2015 2:03:28 AM      MDM   Final diagnoses:  Chest wall pain  Anxiety    Patient here for evaluation of left-sided chest pain. Presentation is not consistent with ACS, PE, dissection. He is improved on repeat evaluation following Ativan. Discussed patient musculoskeletal chest pain as well as anxiety. Discussed importance of psychiatry follow-up. Provided information for cardiology follow-up. Discussed Homecare, return precautions.    Tilden Fossa, MD 11/21/15 [phone]

## 2015-11-21 NOTE — ED Notes (Signed)
Attempted to draw labs, unsuccessful.   

## 2015-11-21 NOTE — ED Notes (Signed)
Patient left AMA.

## 2015-11-21 NOTE — ED Notes (Signed)
Phone call from [facility], patient left AMA and went to [facility] at this time.

## 2015-11-21 NOTE — Discharge Instructions (Signed)
Chest Wall Pain °Chest wall pain is pain in or around the bones and muscles of your chest. Sometimes, an injury causes this pain. Sometimes, the cause may not be known. This pain may take several weeks or longer to get better. °HOME CARE INSTRUCTIONS  °Pay attention to any changes in your symptoms. Take these actions to help with your pain:  °· Rest as told by your health care provider.   °· Avoid activities that cause pain. These include any activities that use your chest muscles or your abdominal and side muscles to lift heavy items.    °· If directed, apply ice to the painful area: °· Put ice in a plastic bag. °· Place a towel between your skin and the bag. °· Leave the ice on for 20 minutes, 2-3 times per day. °· Take over-the-counter and prescription medicines only as told by your health care provider. °· Do not use tobacco products, including cigarettes, chewing tobacco, and e-cigarettes. If you need help quitting, ask your health care provider. °· Keep all follow-up visits as told by your health care provider. This is important. °SEEK MEDICAL CARE IF: °· You have a fever. °· Your chest pain becomes worse. °· You have new symptoms. °SEEK IMMEDIATE MEDICAL CARE IF: °· You have nausea or vomiting. °· You feel sweaty or light-headed. °· You have a cough with phlegm (sputum) or you cough up blood. °· You develop shortness of breath. °  °This information is not intended to replace advice given to you by your health care provider. Make sure you discuss any questions you have with your health care provider. °  °Document Released: 10/20/2005 Document Revised: 07/11/2015 Document Reviewed: 01/15/2015 °Elsevier Interactive Patient Education ©2016 [employer] ° °Panic Attacks °Panic attacks are sudden, short-lived surges of severe anxiety, fear, or discomfort. They may occur for no reason when you are relaxed, when you are anxious, or when you are sleeping. Panic attacks may occur for a number of reasons:  °· Healthy  people occasionally have panic attacks in extreme, life-threatening situations, such as war or natural disasters. Normal anxiety is a protective mechanism of the body that helps us react to danger (fight or flight response). °· Panic attacks are often seen with anxiety disorders, such as panic disorder, social anxiety disorder, generalized anxiety disorder, and phobias. Anxiety disorders cause excessive or uncontrollable anxiety. They may interfere with your relationships or other life activities. °· Panic attacks are sometimes seen with other mental illnesses, such as depression and posttraumatic stress disorder. °· Certain medical conditions, prescription medicines, and drugs of abuse can cause panic attacks. °SYMPTOMS  °Panic attacks start suddenly, peak within 20 minutes, and are accompanied by four or more of the following symptoms: °· Pounding heart or fast heart rate (palpitations). °· Sweating. °· Trembling or shaking. °· Shortness of breath or feeling smothered. °· Feeling choked. °· Chest pain or discomfort. °· Nausea or strange feeling in your stomach. °· Dizziness, light-headedness, or feeling like you will faint. °· Chills or hot flushes. °· Numbness or tingling in your lips or hands and feet. °· Feeling that things are not real or feeling that you are not yourself. °· Fear of losing control or going crazy. °· Fear of dying. °Some of these symptoms can mimic serious medical conditions. For example, you may think you are having a heart attack. Although panic attacks can be very scary, they are not life threatening. °DIAGNOSIS  °Panic attacks are diagnosed through an assessment by your health care provider. Your health care   provider will ask questions about your symptoms, such as where and when they occurred. Your health care provider will also ask about your medical history and use of alcohol and drugs, including prescription medicines. Your health care provider may order blood tests or other studies to  rule out a serious medical condition. Your health care provider may refer you to a mental health professional for further evaluation. °TREATMENT  °· Most healthy people who have one or two panic attacks in an extreme, life-threatening situation will not require treatment. °· The treatment for panic attacks associated with anxiety disorders or other mental illness typically involves counseling with a mental health professional, medicine, or a combination of both. Your health care provider will help determine what treatment is best for you. °· Panic attacks due to physical illness usually go away with treatment of the illness. If prescription medicine is causing panic attacks, talk with your health care provider about stopping the medicine, decreasing the dose, or substituting another medicine. °· Panic attacks due to alcohol or drug abuse go away with abstinence. Some adults need professional help in order to stop drinking or using drugs. °HOME CARE INSTRUCTIONS  °· Take all medicines as directed by your health care provider.   °· Schedule and attend follow-up visits as directed by your health care provider. It is important to keep all your appointments. °SEEK MEDICAL CARE IF: °· You are not able to take your medicines as prescribed. °· Your symptoms do not improve or get worse. °SEEK IMMEDIATE MEDICAL CARE IF:  °· You experience panic attack symptoms that are different than your usual symptoms. °· You have serious thoughts about hurting yourself or others. °· You are taking medicine for panic attacks and have a serious side effect. °MAKE SURE YOU: °· Understand these instructions. °· Will watch your condition. °· Will get help right away if you are not doing well or get worse. °  °This information is not intended to replace advice given to you by your health care provider. Make sure you discuss any questions you have with your health care provider. °  °Document Released: 10/20/2005 Document Revised: 10/25/2013  Document Reviewed: 06/03/2013 °Elsevier Interactive Patient Education ©2016 [employer] ° °

## 2015-11-21 NOTE — ED Notes (Signed)
Pt is complaining of chest pain that is recurrent. Patient was seen 2 weeks ago for same symptoms. Dx with panic attacks and valium. Pt was at [facility] ED, had an assigned room and left facility to come to [facility]. Pt appears to be in acute distress.

## 2015-11-21 NOTE — ED Notes (Addendum)
Patient left AMA. Patient became irate and irritated at RN asking questions for assessment. Raised his voice. Father and mother present in room. Mother hostile toward nurse. Stated "what if he couldn't talk? What would you do then? (referring to asking questions that patient did not wish to answer). RN stated that if patient was nonverbal, then subjective assessment would be completed. Patient was not nonverbal, and RN attempted to explain that questions needed to be asked in order to provide care to patient. Patient very anxious. Patient escalated his behavior, and stated that he was "going to your competitor [facility]." Explained that [name] is part of the [facility] system. Parents demanded that MD be called into the room "ASAP." Dr. Blinda Leatherwood in another patient's room at the moment. Patient snatched BP cuff, pulse ox & electrodes off of himself. RN went out of room to locate Dr. Blinda Leatherwood. Patient and parents stomped out of room, and headed to lobby. Dr. Blinda Leatherwood notified that patient left AMA to go to [facility].

## 2015-11-28 ENCOUNTER — Encounter (HOSPITAL_BASED_OUTPATIENT_CLINIC_OR_DEPARTMENT_OTHER): Payer: Self-pay

## 2015-11-28 ENCOUNTER — Emergency Department (HOSPITAL_BASED_OUTPATIENT_CLINIC_OR_DEPARTMENT_OTHER)
Admission: EM | Admit: 2015-11-28 | Discharge: 2015-11-28 | Discharge status: Against Medical Advice | Payer: Medicaid Other | Attending: Emergency Medicine | Admitting: Emergency Medicine

## 2015-11-28 ENCOUNTER — Emergency Department (HOSPITAL_BASED_OUTPATIENT_CLINIC_OR_DEPARTMENT_OTHER): Payer: Medicaid Other

## 2015-11-28 DIAGNOSIS — R079 Chest pain, unspecified: Secondary | ICD-10-CM | POA: Diagnosis not present

## 2015-11-28 DIAGNOSIS — M6281 Muscle weakness (generalized): Secondary | ICD-10-CM | POA: Insufficient documentation

## 2015-11-28 DIAGNOSIS — Z8709 Personal history of other diseases of the respiratory system: Secondary | ICD-10-CM | POA: Insufficient documentation

## 2015-11-28 DIAGNOSIS — Z8659 Personal history of other mental and behavioral disorders: Secondary | ICD-10-CM | POA: Insufficient documentation

## 2015-11-28 DIAGNOSIS — Z87891 Personal history of nicotine dependence: Secondary | ICD-10-CM | POA: Diagnosis not present

## 2015-11-28 DIAGNOSIS — R0602 Shortness of breath: Secondary | ICD-10-CM | POA: Diagnosis not present

## 2015-11-28 DIAGNOSIS — R410 Disorientation, unspecified: Secondary | ICD-10-CM | POA: Insufficient documentation

## 2015-11-28 HISTORY — DX: Bipolar disorder, unspecified: F31.9

## 2015-11-28 HISTORY — DX: Dorsalgia, unspecified: M54.9

## 2015-11-28 HISTORY — DX: Attention-deficit hyperactivity disorder, unspecified type: F90.9

## 2015-11-28 NOTE — Discharge Instructions (Signed)
Do not hesitate to return to the emergency room for any new, worsening or concerning symptoms.  Please obtain primary care using resource guide below. Let them know that you were seen in the emergency room and that they will need to obtain records for further outpatient management.   Shortness of Breath Shortness of breath means you have trouble breathing. It could also mean that you have a medical problem. You should get immediate medical care for shortness of breath. CAUSES   Not enough oxygen in the air such as with high altitudes or a smoke-filled room.  Certain lung diseases, infections, or problems.  Heart disease or conditions, such as angina or heart failure.  Low red blood cells (anemia).  Poor physical fitness, which can cause shortness of breath when you exercise.  Chest or back injuries or stiffness.  Being overweight.  Smoking.  Anxiety, which can make you feel like you are not getting enough air. DIAGNOSIS  Serious medical problems can often be found during your physical exam. Tests may also be done to determine why you are having shortness of breath. Tests may include:  Chest X-rays.  Lung function tests.  Blood tests.  An electrocardiogram (ECG).  An ambulatory electrocardiogram. An ambulatory ECG records your heartbeat patterns over a 24-hour period.  Exercise testing.  A transthoracic echocardiogram (TTE). During echocardiography, sound waves are used to evaluate how blood flows through your heart.  A transesophageal echocardiogram (TEE).  Imaging scans. Your health care provider may not be able to find a cause for your shortness of breath after your exam. In this case, it is important to have a follow-up exam with your health care provider as directed.  TREATMENT  Treatment for shortness of breath depends on the cause of your symptoms and can vary greatly. HOME CARE INSTRUCTIONS   Do not smoke. Smoking is a common cause of shortness of breath. If  you smoke, ask for help to quit.  Avoid being around chemicals or things that may bother your breathing, such as paint fumes and dust.  Rest as needed. Slowly resume your usual activities.  If medicines were prescribed, take them as directed for the full length of time directed. This includes oxygen and any inhaled medicines.  Keep all follow-up appointments as directed by your health care provider. SEEK MEDICAL CARE IF:   Your condition does not improve in the time expected.  You have a hard time doing your normal activities even with rest.  You have any new symptoms. SEEK IMMEDIATE MEDICAL CARE IF:   Your shortness of breath gets worse.  You feel light-headed, faint, or develop a cough not controlled with medicines.  You start coughing up blood.  You have pain with breathing.  You have chest pain or pain in your arms, shoulders, or abdomen.  You have a fever.  You are unable to walk up stairs or exercise the way you normally do. MAKE SURE YOU:  Understand these instructions.  Will watch your condition.  Will get help right away if you are not doing well or get worse.   This information is not intended to replace advice given to you by your health care provider. Make sure you discuss any questions you have with your health care provider.   Document Released: 07/15/2001 Document Revised: 10/25/2013 Document Reviewed: 01/05/2012 Elsevier Interactive Patient Education 2016 [employer]   Emergency Department Resource Guide 1) Find a Doctor and Pay Out of Pocket Although you won't have to find out who  is covered by your insurance plan, it is a good idea to ask around and get recommendations. You will then need to call the office and see if the doctor you have chosen will accept you as a new patient and what types of options they offer for patients who are self-pay. Some doctors offer discounts or will set up payment plans for their patients who do not have insurance, but  you will need to ask so you aren't surprised when you get to your appointment. ° °2) Contact Your Local Health Department °Not all health departments have doctors that can see patients for sick visits, but many do, so it is worth a call to see if yours does. If you don't know where your local health department is, you can check in your phone book. The CDC also has a tool to help you locate your state's health department, and many state websites also have listings of all of their local health departments. ° °3) Find a Walk-in Clinic °If your illness is not likely to be very severe or complicated, you may want to try a walk in clinic. These are popping up all over the country in pharmacies, drugstores, and shopping centers. They're usually staffed by nurse practitioners or physician assistants that have been trained to treat common illnesses and complaints. They're usually fairly quick and inexpensive. However, if you have serious medical issues or chronic medical problems, these are probably not your best option. ° °No Primary Care Doctor: °- Call Health Connect at  832-8000 - they can help you locate a primary care doctor that  accepts your insurance, provides certain services, etc. °- Physician Referral Service- 1-800-533-3463 ° °Chronic Pain Problems: °Organization         Address  Phone   Notes  °[facility]  ([phone]) [phone] Patients need to be referred by their primary care doctor.  ° °Medication Assistance: °Organization         Address  Phone   Notes  °[place] [facility] [address] °[place], [place] [address] [phone]) [phone] --Must be a resident of [place] °-- Must have NO insurance coverage whatsoever (no Medicaid/ Medicare, etc.) °-- The pt. MUST have a primary care doctor that directs their care regularly and follows them in the community °  °[facility]  (866) 331-1348   °United Way  (888) 892-1162   ° °Agencies that provide inexpensive  medical care: °Organization         Address  Phone   Notes  °[facility]  ([phone]) [phone]   °[facility]    ([phone]) [phone]   °[facility] [address] °[place], [place] [address] [phone]) [phone]   °[facility] [address], °[place] ([phone]) [phone]   °[facility]anned Parenthood    (336) 373-0678   °[facility]    (336) 272-1050   °[facility] ° [address], [place] Phone:  (336) 832-4444, Fax:  (336) [phone] Hours of Operation:  9 am - 6 pm, M-F.  Also accepts Medicaid/Medicare and self-pay.  °[facility] ° [address], [place] Phone: (336) [phone], Fax: (336) [phone]. Hours of Operation:  8:30 am - 5:30 pm, M-F.  Also accepts Medicaid and self-pay.  °[facility] [place] [address], [place] Phone: (336) [phone]   °[facility] [address], [place], [place] (336)[phone],   Ext. 123 Mondays & Thursdays: 7-9 AM.  First 15 patients are seen on a first come, first serve basis.    Medicaid-accepting [facility] Providers:  Organization         Address  Phone   Notes  [facility] [address], [place] [phone](336) [phone] Also accepts self-pay patients.  [facility] [address], [place]  [phone]) [phone]   [facility] [address], [place] [phone]) [phone]   [facility] [address] [place] [address], [place] [phone](336) [phone]   Renaye RakersVeita Bland [address], [place]   [phone]) [phone] Only accepts [place] Access [place] patients after they have their name applied to their card.   Self-Pay (no insurance) in [facility] [facility]:  Organization         Address  Phone   Notes  Sickle Cell Patients, [facility] [address], [place] [phone]) [phone]   [facility] [address], [place] [phone](336) [phone]   [facility]  [address], [place] [phone]) [phone]   [facility]/Dr. Osei-Bonsu  [address] [place] [address], [place] or [phone], [place] [phone] Phone number for both [place] and [place] locations is the same.  [facility] [address], [place] [phone]) [phone]   [facility] [address] [place] [address], [place] or [address] [phone](336) [phone]   [facility] [address], [place] [phone]) [phone], phone; [phone]) [phone], fax Sees patients 1st and 3rd Saturday of every month.  Must not qualify for public or private insurance (i.e. Medicaid, Medicare, [place] Health Choice, Veterans' Benefits)  Household income should be no more than 200% of the poverty level The clinic cannot treat you if you are pregnant or think you are pregnant  Sexually transmitted diseases are not treated at the clinic.    Dental Care: Organization         Address  Phone  Notes  [facility] [address], [place] [phone]) [phone] Accepts children up to age 29 who are enrolled in [place] or [place] Health Choice; pregnant women with a Medicaid card; and children who have applied for Medicaid or [place] Health Choice, but were declined, whose parents can pay a reduced fee at time of service.  [facility] [place]  [address], [place] [phone]) [phone] Accepts children up to age 29 who are enrolled in [place] or [place] Health Choice; pregnant women with a Medicaid card; and children who have applied for Medicaid or [place] Health Choice, but were declined, whose parents can pay a reduced fee at time of service.  [facility]  [address], [place] [phone](336) [phone] Patients are seen by appointment only. Walk-ins are not accepted.  [facility] will see patients 29 years of age and older. Monday - Tuesday (8am-5pm) Most Wednesdays (8:30-5pm) $30 per visit, cash only  [facility]  [address], [facility] [phone]) [phone] Patients are seen by appointment only. Walk-ins are not accepted. [facility] will see patients 29 years of age and older. One Wednesday Evening (Monthly: Volunteer Based).  $30 per visit, cash only  [facility]  [phone]  for adults; Children under age 26, call Graduate Pediatric Dentistry at [phone]. Children aged 49-14, please call [phone] to request a pediatric application.  Dental services are provided in all areas of dental care including fillings, crowns and bridges, complete and partial dentures, implants, gum treatment, root canals, and extractions. Preventive care is also provided. Treatment is provided to both adults and children. Patients are selected via a lottery and there is often a waiting list.   [facility] [address], [place]  [phone] [email or web address]   [facility] [address] [place], [place] [phone], Ext. 123 Second and Fourth Thursday of each month, opens at 6:30 AM; Clinic ends at 9 AM.  Patients are seen on a first-come first-served basis, and a limited number are seen during each clinic.   [facility]  [address] [place], [place] [phone]   Eligibility Requirements You must have lived in [place], [place], or [place] counties for at least the last three months.   You cannot be eligible for state or federal sponsored [place], including [employer], [place], or [employer].   You generally cannot be eligible for healthcare insurance through your employer.    How to apply: Eligibility screenings are held every Tuesday and Wednesday afternoon from 1:00 pm until 4:00 pm. You do not need an appointment for the  interview!  [facility] [address] [place], [place] [phone]   [facility] Health Department  [phone]   [facility] Health Department  [phone]   [facility] Health Department  [phone]    Behavioral Health Resources in the Community: Intensive Outpatient Programs Organization         Address  Phone  Notes  [facility] [address], [place], [place] [phone]   [facility] [address] [place] [address] [place], [place] [phone]   [facility] [address] [place], [place]  [phone]   [facility] [address]  [place], [place] [phone] or [phone]   Substance Abuse Resources Organization         Address  Phone  Notes  [facility]  (830)[phone]   [facility]  [phone]   [facility] [place]  [phone]   Floydene Flock  [phone]   Residential & Outpatient Substance Abuse Program  [phone]   Psychological Services Organization         Address  Phone  Notes  [facility] Health  [phone]   [facility]  [phone]   [facility] [address] [place] [phone] or [phone]    Mobile Crisis Teams Organization         Address  Phone  Notes  [facility], Mobile Crisis Care Unit  [phone]   [facility]  [address] [place], [place] [phone]   Doristine Locks [address] [place] [phone]    Self-Help/Support Groups Organization         Address  Phone             Notes  [facility] [place] - variety of support groups  336- I7437963 Call for more information  Narcotics Anonymous (NA), [facility] [address], [employer] [place]  2 meetings at this location   Chief Executive Officer  Notes  ASAP  Residential Treatment  [address]    [place]  [phone]   [facility]  [address] [place] [address], [place], [place] [phone]   [facility] [address] [place], [place] [phone] Admissions: 8am-3pm M-F  [facility] [address],    [place], [place] [phone]   [facility] [address] [place], [place], [place] [phone]   [facility] [address]  [place], [place] [phone]   [facility] - Intensive Outpatient [address], [place], [place] [phone]   [facility] ([facility] [address]  [place], [place] [phone] or [phone]   [facility] ([facility]) [address], [place], [place] [phone] Accepts Medicaid  [facility] [place] [address],  [place] [phone] Substance Abuse/Addiction Treatment   [facility] Organization         Address  Phone  Notes  [facility]  [phone]   Angie Fava, PhD [address] [place], [place]   [phone] or [phone]   [facility]   [address] [place], [place] [phone]   [facility] [address] [place], [place] [phone] Insurance/Medicaid/sponsorship through [facility] [address]                                    [place], [place] [phone] Therapy/tele-psych/case  [facility] [address], [place] [phone]    Dr. Lolly Mustache  [phone]   [facility] [place]  [employer] [facility] Dept. 1) [address] [place] 2) [address], [place] 3)  [address], [place] [phone]  [phone]   [facility] Child Abuse Hotline [phone]) [phone] or [phone] (After Hours)

## 2015-11-28 NOTE — ED Provider Notes (Signed)
CSN: [id number]     Arrival date & time 11/28/15  1625 History   First MD Initiated Contact with Patient 11/28/15 1651     Chief Complaint  Patient presents with  . Shortness of Breath     (Consider location/radiation/quality/duration/timing/severity/associated sxs/prior Treatment) HPI   Blood pressure 135/83, pulse 80, temperature 98.1 F (36.7 C), temperature source Oral, resp. rate 20, height  (1.803 m), weight 99.791 kg, SpO2 99 %.  William Reese is a 30 y.o. male presenting to the ED for evaluation of " 8 different molds that are in his house" patient presents toxic college he report on the molds. Symptoms include shortness of breath disorientation weakness in the left leg and arm onset several months ago. Patient states that he has chest tightness. Patient is in the ED with his mother who is also being evaluated. He denies fevers, chills, nausea, vomiting, cough. Patient is specifically requesting CAT scan to evaluate the mold. He denies drug use, suicidal ideation, homicidal ideation, auditory or visual hallucinations.  Past Medical History  Diagnosis Date  . Mental health problem   . Lumbar disc disease   . Bronchitis   . Bipolar disorder (HCC)   . ADHD (attention deficit hyperactivity disorder)   . Back pain    History reviewed. No pertinent past surgical history. No family history on file. Social History  Substance Use Topics  . Smoking status: Former Games developer  . Smokeless tobacco: None  . Alcohol Use: No    Review of Systems  10 systems reviewed and found to be negative, except as noted in the HPI.   Allergies  Morphine and related and Flexeril  Home Medications   Prior to Admission medications   Not on File   BP 135/83 mmHg  Pulse 80  Temp(Src) 98.1 F (36.7 C) (Oral)  Resp 20  Ht  (1.803 m)  Wt 99.791 kg  BMI 30.70 kg/m2  SpO2 99% Physical Exam  Constitutional: He is oriented to person, place, and time. He appears well-developed and  well-nourished. No distress.  Patient wearing n95 mask, large headphones, sunglasses  HENT:  Head: Normocephalic.  Mouth/Throat: Oropharynx is clear and moist.  Cardiovascular: Normal rate, regular rhythm and intact distal pulses.   Pulmonary/Chest: Effort normal and breath sounds normal. No stridor. No respiratory distress. He has no wheezes. He has no rales. He exhibits no tenderness.  Abdominal: Soft. Bowel sounds are normal. He exhibits no distension and no mass. There is no tenderness. There is no rebound and no guarding.  Musculoskeletal: Normal range of motion.  Neurological: He is alert and oriented to person, place, and time.  Follows commands, Clear, goal oriented speech, Strength is 5 out of 5x4 extremities, patient ambulates with a coordinated in nonantalgic gait. Sensation is grossly intact.   Psychiatric: He has a normal mood and affect.  Nursing note and vitals reviewed.   ED Course  Procedures (including critical care time) Labs Review Labs Reviewed - No data to display  Imaging Review No results found. I have personally reviewed and evaluated these images and lab results as part of my medical decision-making.   EKG Interpretation   Date/Time:  Wednesday November 28 2015 16:33:26 EST Ventricular Rate:  82 PR Interval:  172 QRS Duration: 80 QT Interval:  356 QTC Calculation: 416 R Axis:   64 Text Interpretation:  Sinus rhythm No significant change since last  tracing Confirmed by FLOYD MD, DANIEL [phone]) on 11/28/2015 4:52:24 PM  MDM   Final diagnoses:  SOB (shortness of breath)    Filed Vitals:   11/28/15 1636  BP: 135/83  Pulse: 80  Temp: 98.1 F (36.7 C)  TempSrc: Oral  Resp: 20  Height:  (1.803 m)  Weight: 99.791 kg  SpO2: 99%    William Reese is 30 y.o. male presenting with shortness of breath, perseverating on mold toxicity. Lung sounds clear to auscultation, saturating well on room air, no tachypnea or tachycardia. I would like  to obtain a chest x-ray that patient has become combative after my initial evaluation. He is cursing and states that he will leave without any further evaluation. Although this patient clearly has psychiatric issues he is not floridly psychotic, can meaningfully discuss the risks and benefits of foregoing treatment. He has capacity for medical decision making and has left AGAINST MEDICAL ADVICE. Patient understands that this may result in his death or disability. I've invited him to return to the ED at anytime for further evaluation. Patient is given a resource guide to establish primary care     Wynetta Emery, PA-C 11/28/15 1831  Melene Plan, DO 11/28/15 2342

## 2015-11-28 NOTE — ED Notes (Signed)
Pt cursing at Pa and staff walking in a fast pace to rm 2 (mother being seen) slammed door , security call pt leaving rm 2 cursing at staff walked swiftly out lobby doors with HPPD

## 2015-11-28 NOTE — ED Notes (Signed)
Pt c/o SOB, "disorientation", weakness to left arm and leg since 10/12/15-states he has mold in his house and feels c/o r/t to mold-states he came in today "feeling tense in my pulmonary vein and that's when i get tensed up"-pt A/O-answering all ?s appropriately-has been seen in ED x 3 for same c/o-pt states mother drove him [facility]

## 2015-12-03 ENCOUNTER — Other Ambulatory Visit (INDEPENDENT_AMBULATORY_CARE_PROVIDER_SITE_OTHER): Payer: Medicaid Other

## 2015-12-03 ENCOUNTER — Ambulatory Visit (INDEPENDENT_AMBULATORY_CARE_PROVIDER_SITE_OTHER): Payer: Medicaid Other | Admitting: Internal Medicine

## 2015-12-03 ENCOUNTER — Ambulatory Visit: Payer: Self-pay | Admitting: Physician Assistant

## 2015-12-03 ENCOUNTER — Encounter: Payer: Self-pay | Admitting: Internal Medicine

## 2015-12-03 VITALS — BP 126/72 | HR 94 | Ht 71.0 in | Wt 222.6 lb

## 2015-12-03 DIAGNOSIS — J45909 Unspecified asthma, uncomplicated: Secondary | ICD-10-CM | POA: Diagnosis not present

## 2015-12-03 DIAGNOSIS — Z77120 Contact with and (suspected) exposure to mold (toxic): Secondary | ICD-10-CM | POA: Diagnosis not present

## 2015-12-03 DIAGNOSIS — F411 Generalized anxiety disorder: Secondary | ICD-10-CM | POA: Diagnosis not present

## 2015-12-03 LAB — CBC WITH DIFFERENTIAL/PLATELET
Basophils Absolute: 0 10*3/uL (ref 0.0–0.1)
Basophils Relative: 0.4 % (ref 0.0–3.0)
EOS PCT: 2.8 % (ref 0.0–5.0)
Eosinophils Absolute: 0.2 10*3/uL (ref 0.0–0.7)
HCT: 49.2 % (ref 39.0–52.0)
HEMOGLOBIN: 16.5 g/dL (ref 13.0–17.0)
LYMPHS ABS: 1.9 10*3/uL (ref 0.7–4.0)
Lymphocytes Relative: 24 % (ref 12.0–46.0)
MCHC: 33.6 g/dL (ref 30.0–36.0)
MCV: 90.9 fl (ref 78.0–100.0)
MONOS PCT: 6.7 % (ref 3.0–12.0)
Monocytes Absolute: 0.5 10*3/uL (ref 0.1–1.0)
Neutro Abs: 5.3 10*3/uL (ref 1.4–7.7)
Neutrophils Relative %: 66.1 % (ref 43.0–77.0)
Platelets: 232 10*3/uL (ref 150.0–400.0)
RBC: 5.41 Mil/uL (ref 4.22–5.81)
RDW: 13.4 % (ref 11.5–15.5)
WBC: 7.9 10*3/uL (ref 4.0–10.5)

## 2015-12-03 NOTE — Progress Notes (Addendum)
12/03/2015-30 year old male former smoker referred courtesy of Dr Ezzard Flax; here with his mother, expressing concern about mold exposure. Went to ED-tingle in head to face, SOB, eyes rolling back, stopped breathing, passed out. They have been living in an old home for the last 2 or 3 months, where there has been mold exposure. When they first moved in its musty. Previous owners had cats. They cleaned mold from the fixtures in the house. A commercial mold evaluation company generated report describing numerous molds identified, as spores, including Aspergillus. 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 Complains of an aching sensation in the anterior left axillary line with more recent similar discomfort in the right midaxillary line. He says he gasps for air, his heart pounds, nasal discharge, problems with memory. Not much cough or wheeze currently but he says he is had bronchitis in the past. No other history of asthma or allergy. He admits being frightened by all of this and says he wants an MRI/MRA of his chest. Chest x-ray 11/21/2015-negative He came to the waiting room wearing a dust and pollen mask to protect himself from "mold". Office Spirometry 12/03/2015- mild small airways obstructive disease is suggested, but the spirometry contour plot shows strong evidence of submaximal effort despite a lot of coaching. Lung function is no worse than mild airway obstruction, but the results are unreliable because of his obvious lack of effort.  Prior to Admission medications   Medication Sig Start Date End Date Taking? Authorizing Provider  hydrOXYzine (ATARAX/VISTARIL) 25 MG tablet Reported on 12/03/2015 11/22/15   Historical Provider, MD  naproxen (NAPROSYN) 375 MG tablet Take 375 mg by mouth 4 (four) times daily. Reported on 12/03/2015 11/29/15   Historical Provider, MD   Past Medical History  Diagnosis Date  . Mental health problem   . Lumbar disc disease   . Bronchitis   . Bipolar disorder (HCC)   .  ADHD (attention deficit hyperactivity disorder)   . Back pain    No past surgical history on file. No family history on file. Social History   Social History  . Marital Status: Single    Spouse Name: N/A  . Number of Children: N/A  . Years of Education: N/A   Occupational History  . Not on file.   Social History Main Topics  . Smoking status: Former Smoker -- 0.50 packs/day    Types: Cigarettes    Start date: 12/03/2011    Quit date: 10/03/2015  . Smokeless tobacco: Not on file  . Alcohol Use: No  . Drug Use: No  . Sexual Activity: Not on file   Other Topics Concern  . Not on file   Social History Narrative   ROS-see HPI   Negative unless "+" Constitutional:    + weight loss, night sweats, fevers, chills, fatigue, lassitude. HEENT:    headaches, + difficulty swallowing, tooth/dental problems, sore throat,       + sneezing, itching, ear ache, + nasal congestion, post nasal drip, snoring CV:    chest pain, orthopnea, PND, swelling in lower extremities, anasarca,                                                             dizziness, + palpitations Resp:   + shortness of breath with exertion or at rest.               +  productive cough,   + non-productive cough, + coughing up of blood.              + change in color of mucus.  wheezing.   Skin:    rash or lesions. GI:  No-   heartburn, indigestion, abdominal pain, nausea, vomiting, diarrhea,                 change in bowel habits, loss of appetite GU: dysuria, change in color of urine, no urgency or frequency.   flank pain. MS:   joint pain, stiffness, decreased range of motion, back pain. Neuro-     nothing unusual Psych:  change in mood or affect.  + depression or anxiety.   memory loss.  OBJ- Physical Exam-+ when I had him remove his dust mask long enough for me to examine his nose and throat,                               he looked very anxious with rapidly scanning gaze side to side, not resolved until I had him put  his mask back on. General- Alert, Oriented, Affect+ anxious, Distress- none acute Skin- rash-none, lesions- none, excoriation- none Lymphadenopathy- none Head- atraumatic            Eyes- Gross vision intact, PERRLA, conjunctivae and secretions clear            Ears- Hearing, canals-normal            Nose- Clear, no-Septal dev, mucus +, polyps, erosion, perforation             Throat- Mallampati IV , mucosa clear , drainage- none, tonsils- atrophic Neck- flexible , trachea midline, no stridor , thyroid nl, carotid no bruit Chest - symmetrical excursion , unlabored           Heart/CV- RRR , no murmur , no gallop  , no rub, nl s1 s2                           - JVD- none , edema- none, stasis changes- none, varices- none           Lung- clear to P&A, wheeze- none, cough- none , dullness-none, rub- none           Chest wall-  Abd-  Br/ Gen/ Rectal- Not done, not indicated Extrem- cyanosis- none, clubbing, none, atrophy- none, strength- nl Neuro- grossly intact to observation

## 2015-12-03 NOTE — Assessment & Plan Note (Signed)
Substantial anxiety about this mold issue, out of proportion to medical concerns in my opinion. I explained that to him and his mother. By his description he has probably had some hyperventilation episodes. When his mask was off so I could examine his nose and throat, I thought he was going to have a panic attack-eyes were darting side to side and he is beginning to breathe heavily. With reassurance he calmed down.

## 2015-12-03 NOTE — Assessment & Plan Note (Addendum)
They live in an older home which does yield a variety of mold spores. This does not prove the environment is harming him, although would be best to live elsewhere. He doesn't think the landlord is prepared to much renovation/remediation. Plan-in vitro mold antibody assays to assess degree of inflammatory response. I see no purpose in getting an MRI at this point with clear recldent chest x-ray. Pending workup, a daily antihistamine would do no harm.

## 2015-12-03 NOTE — Patient Instructions (Addendum)
Order- lab- CBC w diff, Allergy profile, Hypersensitivity pneumonia panel, Mold profile, sed rate    Dx allergic bronchitis  Order- Office spirometry  It may help you to take a daily antihistamine like Claritin or Zyrtec, while needed

## 2015-12-04 LAB — RESPIRATORY ALLERGY PROFILE REGION II ~~LOC~~
ALTERNARIA ALTERNATA: 2.94 kU/L — AB
Allergen, Comm Silver Birch, t9: <0.1 kU/L
Allergen, Cottonwood, t14: <0.1 kU/L
Allergen, D pternoyssinus,d7: <0.1 kU/L
Allergen, Mulberry, t76: <0.1 kU/L
Aspergillus fumigatus, m3: 0.44 kU/L — ABNORMAL HIGH
Bermuda Grass: <0.1 kU/L
CAT DANDER: 0.17 kU/L — AB
Cladosporium Herbarum: 0.69 kU/L — ABNORMAL HIGH
Cockroach: <0.1 kU/L
Common Ragweed: <0.1 kU/L
D. farinae: <0.1 kU/L
Dog Dander: <0.1 kU/L
Elm IgE: <0.1 kU/L
IGE (IMMUNOGLOBULIN E), SERUM: 81 kU/L (ref <115)
Johnson Grass: <0.1 kU/L
Pecan/Hickory Tree IgE: <0.1 kU/L
Penicillium Notatum: <0.1 kU/L
Timothy Grass: <0.1 kU/L

## 2015-12-04 LAB — SEDIMENTATION RATE: Sed Rate: 5 mm/hr (ref 0–22)

## 2015-12-05 ENCOUNTER — Telehealth: Payer: Self-pay | Admitting: Internal Medicine

## 2015-12-05 ENCOUNTER — Ambulatory Visit (INDEPENDENT_AMBULATORY_CARE_PROVIDER_SITE_OTHER): Payer: Medicaid Other | Admitting: Physician Assistant

## 2015-12-05 ENCOUNTER — Encounter: Payer: Self-pay | Admitting: Physician Assistant

## 2015-12-05 VITALS — BP 126/70 | HR 87 | Ht 71.0 in | Wt 219.4 lb

## 2015-12-05 DIAGNOSIS — R0789 Other chest pain: Secondary | ICD-10-CM | POA: Diagnosis not present

## 2015-12-05 DIAGNOSIS — Z77120 Contact with and (suspected) exposure to mold (toxic): Secondary | ICD-10-CM

## 2015-12-05 DIAGNOSIS — R002 Palpitations: Secondary | ICD-10-CM | POA: Diagnosis not present

## 2015-12-05 DIAGNOSIS — R079 Chest pain, unspecified: Secondary | ICD-10-CM | POA: Insufficient documentation

## 2015-12-05 NOTE — Progress Notes (Signed)
Patient ID: William Reese, male   DOB: 1986-10-06, 30 y.o.   MRN: [id number]    Date:  12/05/2015   ID:  William Reese, DOB [birth year], MRN [id number]  PCP:  Pcp Not In System  Primary Cardiologist:  New  Chief complaint: Chest pain   History of Present Illness: [name] is a 30 y.o. male with history of bipolar disorder ADHD, bronchitis, tobacco abuse (12+ pack years. Quit 4 months ago) and back pain. He has no family history of coronary disease at an early older age. He hashas been seen multiple times in the emergency room last 3 months for chest pain and shortness of breath.  He seems be very focused on mold that is growing in his house.  He is here to follow-up complaints of chest pain. He describes as a dull ache/tightness which seemed to get worse when he was doing pushups. He also reports body aches, shortness of breath, headaches, memory loss, heart pounding, nausea, diaphoresis and dizziness.  The patient currently denies vomiting, fever,  orthopnea,  PND, cough, congestion, abdominal pain, hematochezia, melena, lower extremity edema, claudication.  Wt Readings from Last 3 Encounters:  12/05/15 219 lb 6.4 oz (99.519 kg)  12/03/15 222 lb 9.6 oz (100.971 kg)  11/28/15 220 lb (99.791 kg)     Past Medical History  Diagnosis Date  . Mental health problem   . Lumbar disc disease   . Bronchitis   . Bipolar disorder (HCC)   . ADHD (attention deficit hyperactivity disorder)   . Back pain     Current Outpatient Prescriptions  Medication Sig Dispense Refill  . hydrOXYzine (ATARAX/VISTARIL) 25 MG tablet Reported on 12/03/2015  0  . naproxen (NAPROSYN) 375 MG tablet Take 375 mg by mouth 4 (four) times daily. Reported on 12/03/2015  0  . QUEtiapine (SEROQUEL) 400 MG tablet Take 400 mg by mouth at bedtime.     No current facility-administered medications for this visit.    Allergies:    Allergies  Allergen Reactions  . Morphine And Related Nausea And Vomiting and Other (See  Comments)    Migraine  . Flexeril [Cyclobenzaprine] Rash    Social History:  The patient  reports that he quit smoking about 2 months ago. His smoking use included Cigarettes. He started smoking about 4 years ago. He smoked 0.50 packs per day. He does not have any smokeless tobacco history on file. He reports that he does not drink alcohol or use illicit drugs.   Family history:  No family history on file.  ROS:  Please see the history of present illness.  All other systems reviewed and negative.   PHYSICAL EXAM: VS:  BP 126/70 mmHg  Pulse 87  Ht  (1.803 m)  Wt 219 lb 6.4 oz (99.519 kg)  BMI 30.61 kg/m2 Well nourished, well developed, in no acute distress HEENT: Pupils are equal round react to light accommodation extraocular movements are intact.  Neck: no JVDNo cervical lymphadenopathy. Cardiac: Regular rate and rhythm without murmurs rubs or gallops. Musculoskeletal: Patient is very tender inferior to the left clavicle.  Lungs:  clear to auscultation bilaterally, no wheezing, rhonchi or rales Abd: soft, nontender, positive bowel sounds all quadrants, no hepatosplenomegaly Ext: no lower extremity edema.  2+ radial and dorsalis pedis pulses. Skin: warm and dry Neuro:  Grossly normal  EKG:   Sinus rhythm rate 82 bpm  ASSESSMENT AND PLAN:  Problem List Items Addressed This Visit    Palpitations   Musculoskeletal chest pain  Chest pain - Primary   Relevant Orders   Exercise Tolerance Test     Patient clearly has reproducible muscular skeletal pain under his left clavicle. In fact, his response to palpation seemed overly exaggerated. His other issue is chest tightness and a dull ache that seem to be exertional. He doesn't have any family history of early heart disease, or any heart disease for that matter however, he does have a history of tobacco abuse since age 61.  I'll schedule a GXT to evaluate for ischemia.  Most of his concerns seem to be focused around mold that is  in his house and the potential for respiratory illness.  I recommended he see a primary care provider within [facility] overall care and referrals to specialists instead of going to the emergency room.  Ms. clearly very anxious and that is likely what is driving his heart rate up although in the office his pulse was 87 bpm. He can follow-up as needed.

## 2015-12-05 NOTE — Patient Instructions (Signed)
Your physician has requested that you have an exercise tolerance test. For further information please visit [email or web address] Please also follow instruction sheet, as given.  you will be contacted with these results.  Your physician recommends that you schedule a follow-up appointment as needed.

## 2015-12-05 NOTE — Telephone Encounter (Signed)
Patient Returned call  [phone]

## 2015-12-05 NOTE — Telephone Encounter (Signed)
Patient requesting referral to Dr. Adaline Sill at [facility].  Bronchopulmonary Aspergillus. 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  Symptoms include: SOB, heart palpitations, reactions to any prescribed medications, dull ache in chest.  Patient says that he read that this illness is diagnosed postmortem and he doesn't want this happening to him so he wants to see this provider at [facility] immediately.  Dr. Maple Hudson, please advise.

## 2015-12-05 NOTE — Telephone Encounter (Signed)
lmtcb for pt.  

## 2015-12-06 NOTE — Telephone Encounter (Signed)
Pt cb, informed him per micheel note that referral was sent, patient verbalized understanding. Nothing further needed

## 2015-12-06 NOTE — Telephone Encounter (Signed)
Ok to make referral as requested

## 2015-12-06 NOTE — Telephone Encounter (Signed)
Order entered for referral. Attempted to contact patient to advise that referral has been ordered.  Left message for patient to call back.

## 2015-12-09 LAB — HYPERSENSITIVITY PNUEMONITIS PROFILE

## 2015-12-10 ENCOUNTER — Ambulatory Visit: Payer: Medicaid Other | Admitting: Internal Medicine

## 2015-12-11 ENCOUNTER — Telehealth: Payer: Self-pay | Admitting: Internal Medicine

## 2015-12-11 NOTE — Telephone Encounter (Signed)
Spoke with pt, he is requesting lab results.  Dr .Maple Hudson, please advise.  Thank you!  Note: Pt aware CY will not be in office until tomorrow and is ok with this.

## 2015-12-11 NOTE — Telephone Encounter (Signed)
LMTC x 1  

## 2015-12-11 NOTE — Telephone Encounter (Signed)
Patient info sent out as Result note

## 2015-12-11 NOTE — Telephone Encounter (Signed)
Notes Recorded by Waymon Budge, MD on 12/11/2015 at 2:58 PM There were mild elevations of allergy antibodies (IgE) for dog and some common molds, especially Alternaria. Antibody levels associated with hypersensitivity pneumonitis from molds were negative ------------------------------------------------------ Pt is aware of results. He has dogs currently. He wants to know what his treatment options are.  CY - please advise. Thanks.

## 2015-12-11 NOTE — Telephone Encounter (Signed)
[phone] pt calling back

## 2015-12-12 NOTE — Telephone Encounter (Signed)
Keep dogs out of bedroom. Move from home if there are significant mold concerns. Ok to take non-sedating antihistamine like claritin as directed on box.

## 2015-12-12 NOTE — Telephone Encounter (Signed)
LMTCB x2 for patient.  

## 2015-12-12 NOTE — Telephone Encounter (Signed)
LMTCB

## 2015-12-12 NOTE — Telephone Encounter (Signed)
Pt is wanting to get referred to [facility] [place] [phone] fax     [phone] phones and wants to be clinical neuropsycholigist [phone]   Dr Darl Pikes phd

## 2015-12-12 NOTE — Telephone Encounter (Signed)
LMTCB for pt 

## 2015-12-12 NOTE — Telephone Encounter (Signed)
Patient returned call, [phone].

## 2015-12-12 NOTE — Telephone Encounter (Signed)
Patient returned call, may be reached at [phone].

## 2015-12-12 NOTE — Telephone Encounter (Signed)
I spoke with the pt about his lab results and recs per CDY  He verbalized understanding  He feels that due to mold allergy he needs to see ID and Neuropsychiatrist  He wants CDY to refer to both since he has no PCP  I advised that I will check with CDY to see if he feels this would be appropriate  Please advise, thanks!

## 2015-12-12 NOTE — Telephone Encounter (Signed)
lmomtcb x1 

## 2015-12-13 NOTE — Telephone Encounter (Signed)
Patient Returned call [phone]

## 2015-12-13 NOTE — Telephone Encounter (Signed)
Called spoke with pt. He is aware of CDY response. He had no questions and nothing further needed

## 2015-12-13 NOTE — Telephone Encounter (Signed)
These are not medically necessary or useful referrals and I won't be able to do them. What the patient needs to do is establish a primary physician. That will serve him best in the long run.

## 2015-12-13 NOTE — Telephone Encounter (Signed)
lmtcb for pt.  

## 2015-12-13 NOTE — Telephone Encounter (Signed)
lmtcb x1 for pt. 

## 2015-12-18 ENCOUNTER — Telehealth: Payer: Self-pay | Admitting: Internal Medicine

## 2015-12-18 NOTE — Telephone Encounter (Signed)
Fine- tell patient that [facility] declined to offer him an appointment

## 2015-12-18 NOTE — Telephone Encounter (Signed)
Will route message to CY to address. 

## 2015-12-18 NOTE — Telephone Encounter (Signed)
LMTCB x1 for pt.  

## 2015-12-19 ENCOUNTER — Telehealth (HOSPITAL_COMMUNITY): Payer: Self-pay

## 2015-12-19 ENCOUNTER — Ambulatory Visit: Payer: Medicaid Other | Admitting: Allergy and Immunology

## 2015-12-19 NOTE — Telephone Encounter (Signed)
Encounter complete. 

## 2015-12-19 NOTE — Telephone Encounter (Signed)
Spoke with pt. He states the reason they denied his referral is because his PCP has to make the referral. Nothing further was needed.

## 2015-12-21 ENCOUNTER — Inpatient Hospital Stay (HOSPITAL_COMMUNITY): Admission: RE | Admit: 2015-12-21 | Payer: Self-pay | Source: Ambulatory Visit

## 2015-12-24 ENCOUNTER — Ambulatory Visit: Payer: Self-pay | Admitting: Cardiovascular Disease

## 2017-02-12 IMAGING — CR DG CHEST 2V
2 series · 2 of 2 positions shown · non-contrast
Comparison: 05/18/2013

CLINICAL DATA: Left-sided chest pain and shortness of Breath

EXAM:
CHEST - 2 VIEW

[w chest pa]
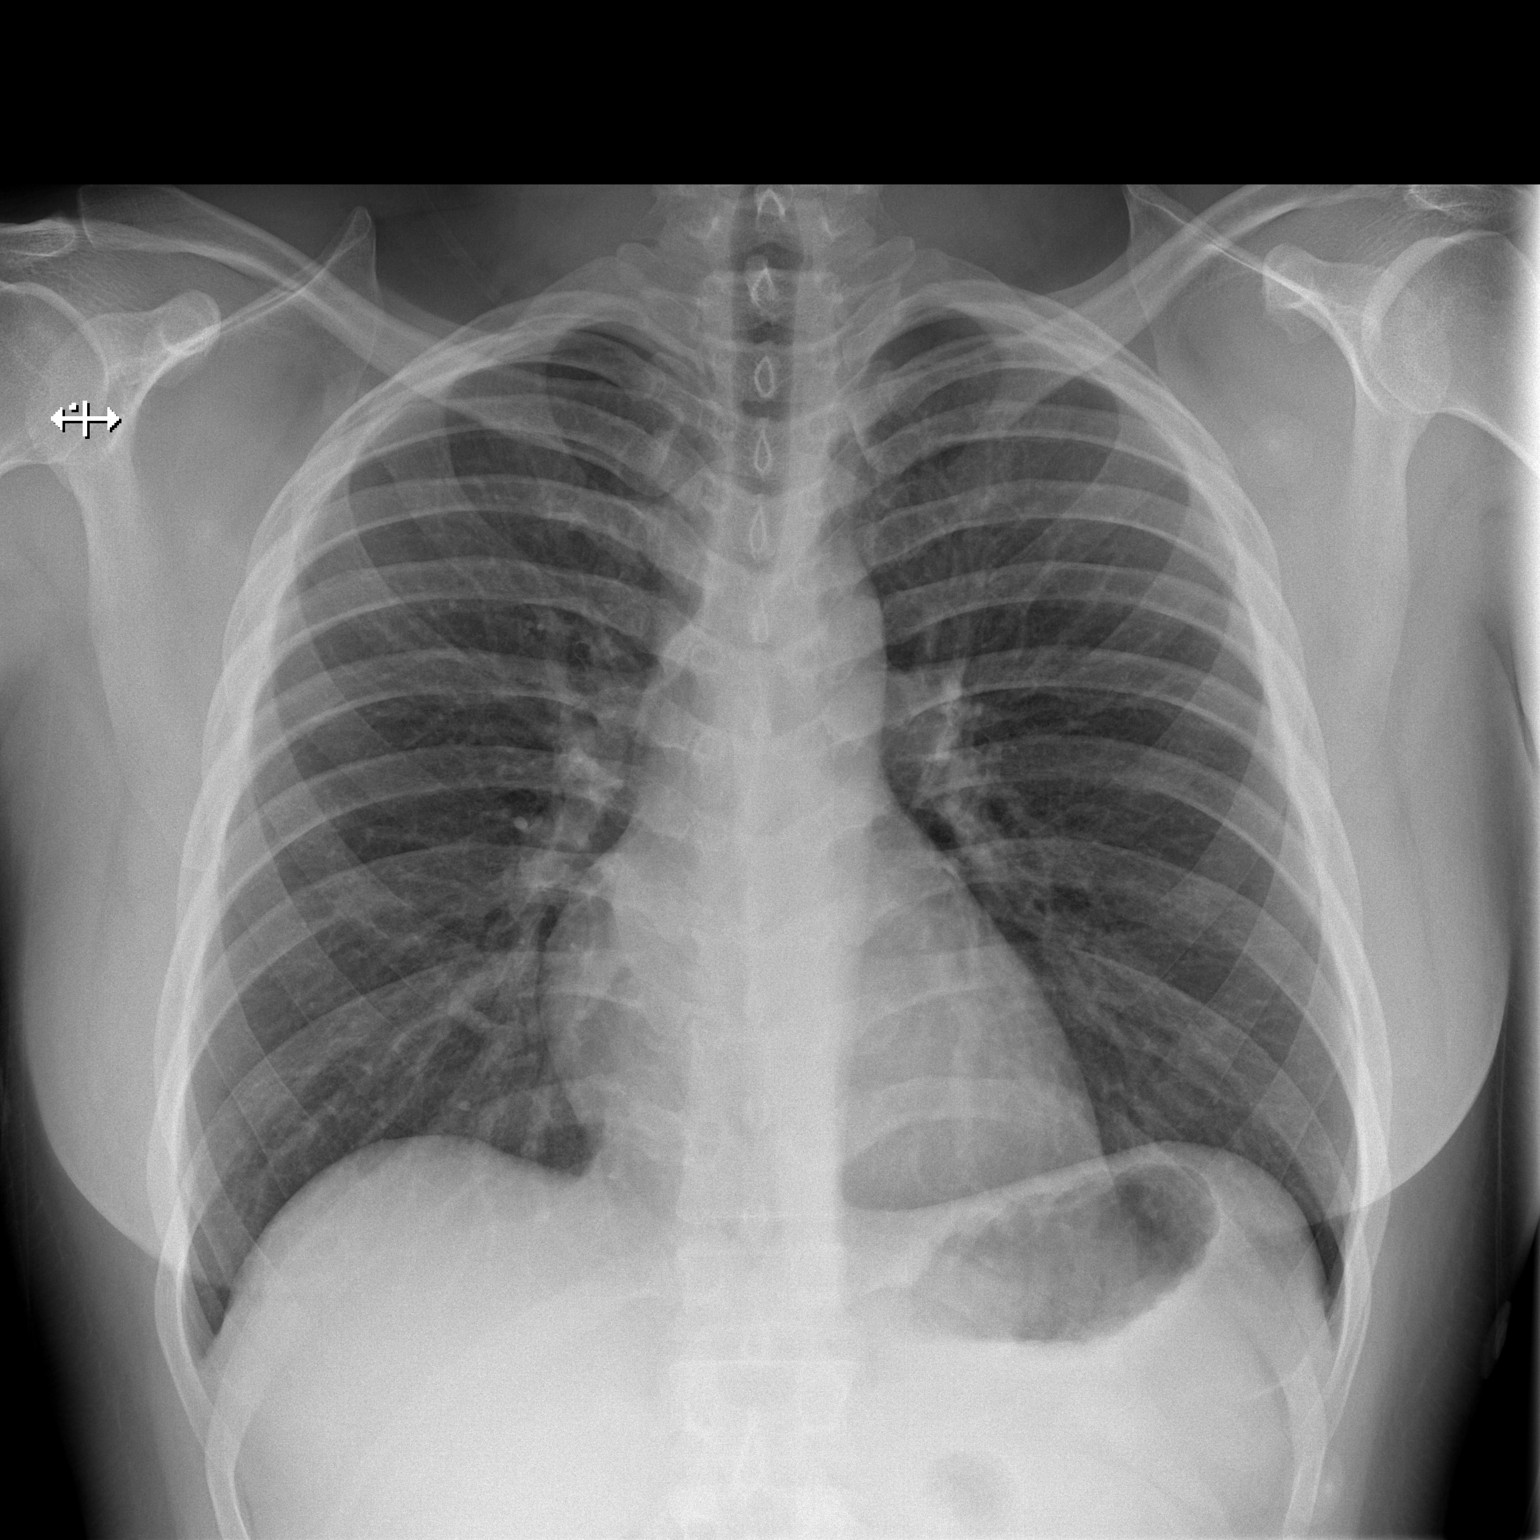

[w chest lat]
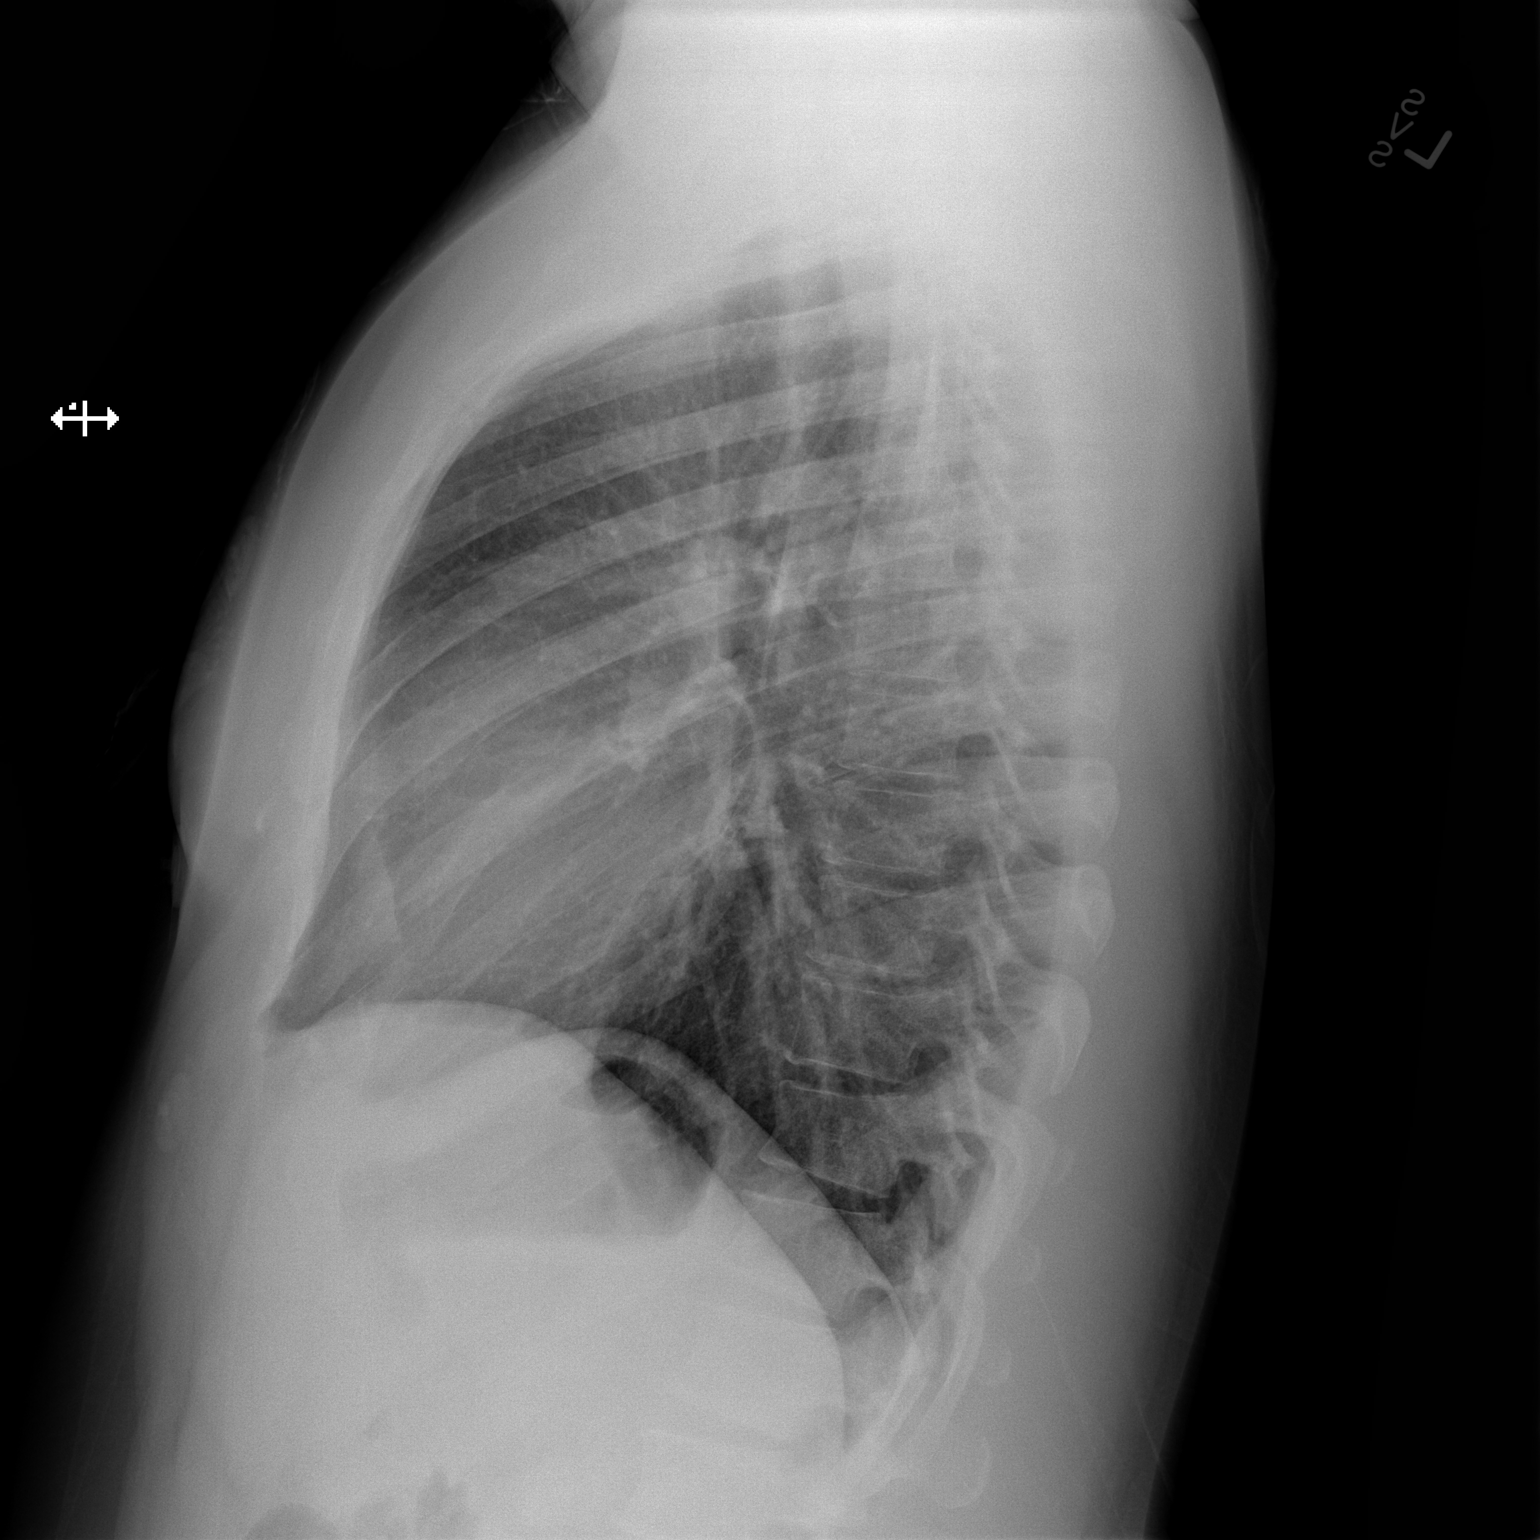

[2 of 2 positions shown; findings below may reference images not displayed]

FINDINGS: The heart size and mediastinal contours are within normal limits.
Both lungs are clear. The visualized skeletal structures are
unremarkable.
IMPRESSION: No active disease.

## 2017-03-04 IMAGING — CR DG CHEST 2V
2 series · 2 of 2 positions shown · non-contrast
Comparison: Chest radiograph performed 11/01/2015

CLINICAL DATA: Acute onset of upper chest pain and shortness of
breath. Initial encounter.

EXAM:
CHEST  2 VIEW

[w chest pa]
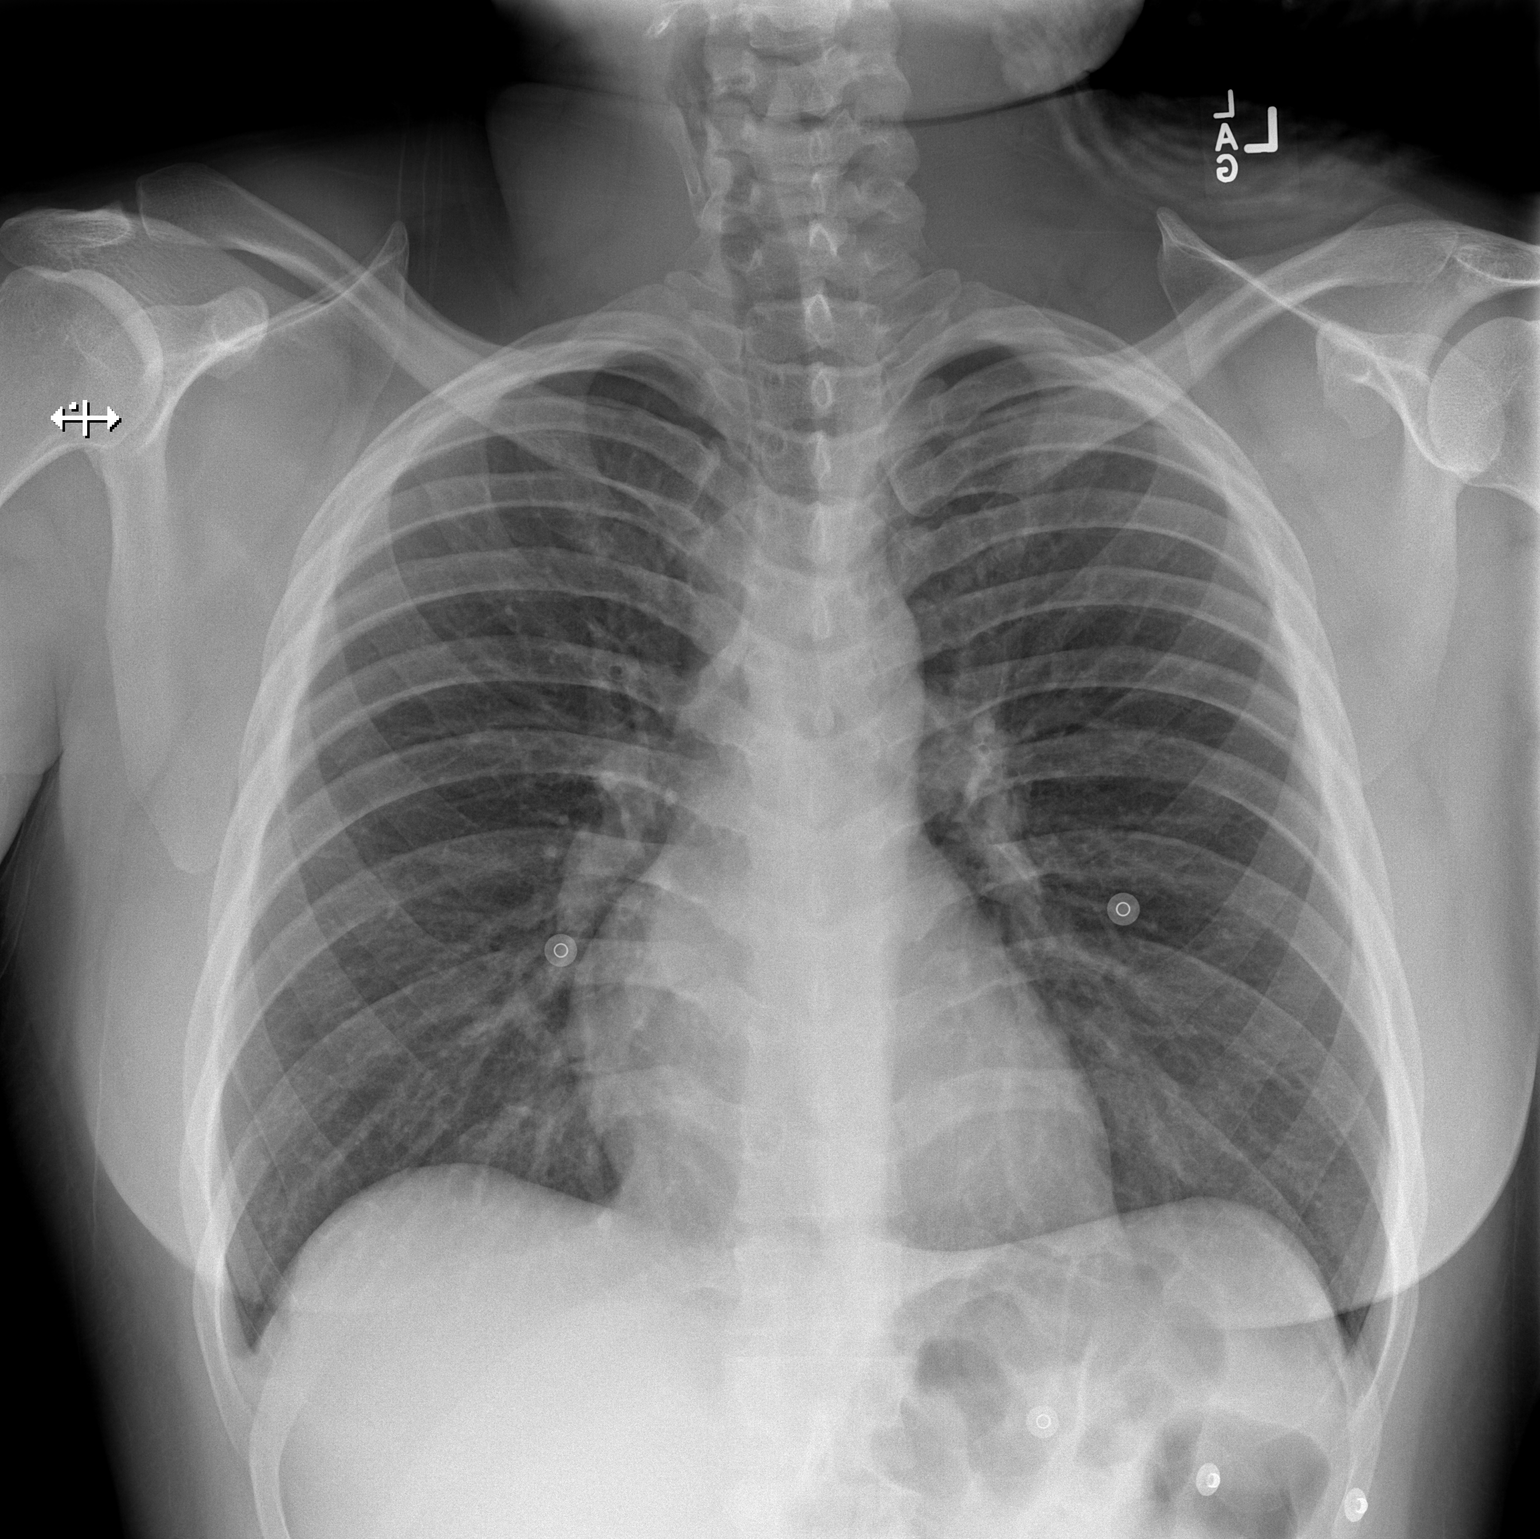

[w chest lat]
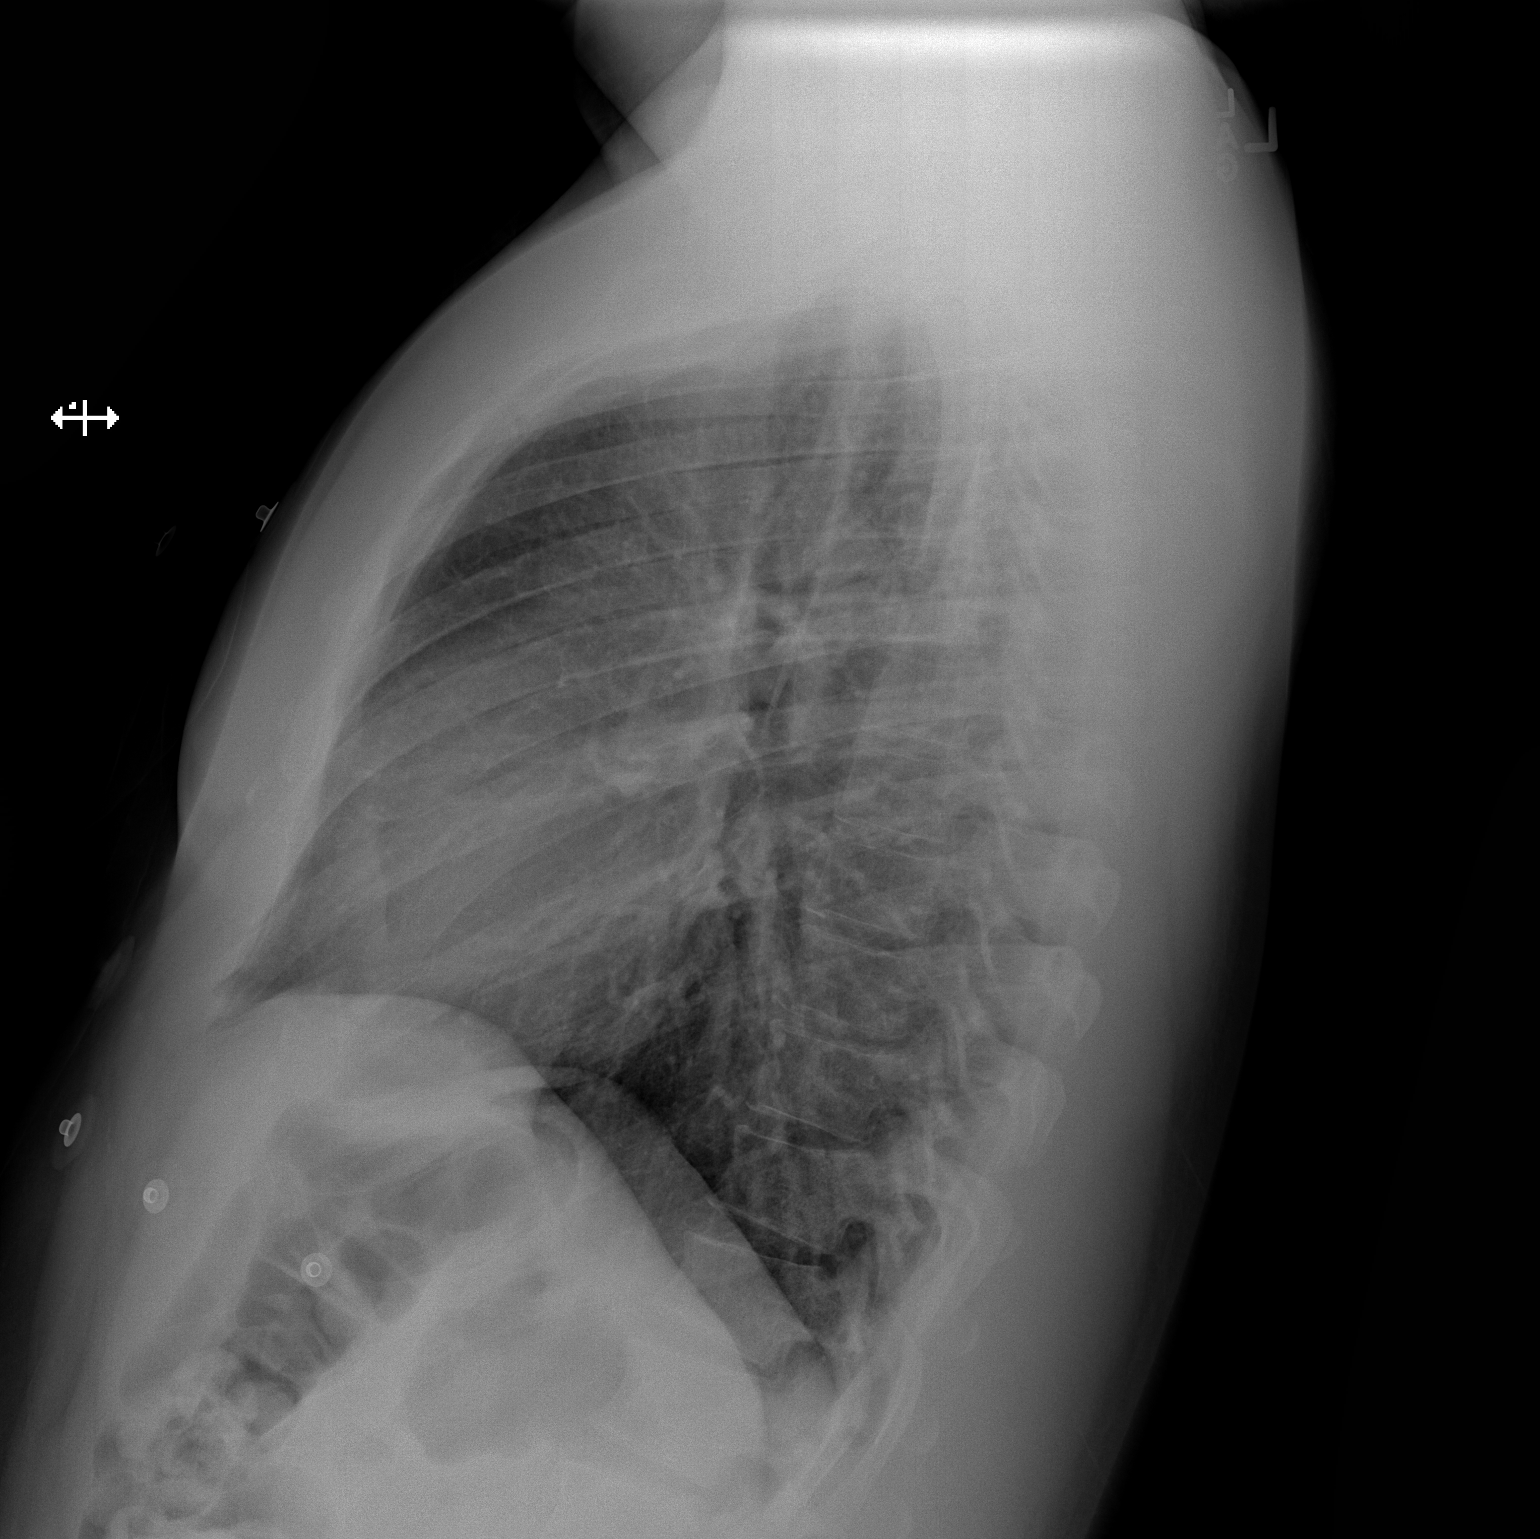

[2 of 2 positions shown; findings below may reference images not displayed]

FINDINGS: The lungs are well-aerated and clear. There is no evidence of focal
opacification, pleural effusion or pneumothorax.

The heart is normal in size; the mediastinal contour is within
normal limits. No acute osseous abnormalities are seen.
IMPRESSION: No acute cardiopulmonary process seen.
